# Patient Record
Sex: Female | Born: 1965 | Race: White | Hispanic: No | Marital: Married | Smoking: Former smoker
Health system: Southern US, Community
[De-identification: ages and names within clinical notes are randomized; demographics above are authoritative.]

## PROBLEM LIST (undated history)

## (undated) DIAGNOSIS — F32A Depression, unspecified: Secondary | ICD-10-CM

## (undated) DIAGNOSIS — E785 Hyperlipidemia, unspecified: Secondary | ICD-10-CM

## (undated) DIAGNOSIS — N63 Unspecified lump in unspecified breast: Principal | ICD-10-CM

## (undated) DIAGNOSIS — K5909 Other constipation: Secondary | ICD-10-CM

## (undated) DIAGNOSIS — D649 Anemia, unspecified: Secondary | ICD-10-CM

## (undated) DIAGNOSIS — K589 Irritable bowel syndrome without diarrhea: Secondary | ICD-10-CM

## (undated) DIAGNOSIS — N390 Urinary tract infection, site not specified: Secondary | ICD-10-CM

## (undated) DIAGNOSIS — B379 Candidiasis, unspecified: Secondary | ICD-10-CM

## (undated) DIAGNOSIS — G43909 Migraine, unspecified, not intractable, without status migrainosus: Secondary | ICD-10-CM

## (undated) DIAGNOSIS — Z87442 Personal history of urinary calculi: Secondary | ICD-10-CM

## (undated) DIAGNOSIS — F1010 Alcohol abuse, uncomplicated: Secondary | ICD-10-CM

## (undated) DIAGNOSIS — E039 Hypothyroidism, unspecified: Secondary | ICD-10-CM

## (undated) DIAGNOSIS — T4145XA Adverse effect of unspecified anesthetic, initial encounter: Secondary | ICD-10-CM

## (undated) DIAGNOSIS — R55 Syncope and collapse: Secondary | ICD-10-CM

## (undated) DIAGNOSIS — E079 Disorder of thyroid, unspecified: Secondary | ICD-10-CM

## (undated) DIAGNOSIS — I959 Hypotension, unspecified: Secondary | ICD-10-CM

## (undated) DIAGNOSIS — M1990 Unspecified osteoarthritis, unspecified site: Secondary | ICD-10-CM

## (undated) DIAGNOSIS — F419 Anxiety disorder, unspecified: Secondary | ICD-10-CM

## (undated) DIAGNOSIS — K635 Polyp of colon: Secondary | ICD-10-CM

## (undated) DIAGNOSIS — F329 Major depressive disorder, single episode, unspecified: Secondary | ICD-10-CM

## (undated) DIAGNOSIS — R42 Dizziness and giddiness: Secondary | ICD-10-CM

## (undated) HISTORY — PX: INCISIONAL BREAST BIOPSY: SHX1812

## (undated) HISTORY — DX: Irritable bowel syndrome, unspecified: K58.9

## (undated) HISTORY — DX: Polyp of colon: K63.5

## (undated) HISTORY — PX: WISDOM TOOTH EXTRACTION: SHX21

## (undated) HISTORY — PX: BREAST ENHANCEMENT SURGERY: SHX7

## (undated) HISTORY — PX: UPPER GI ENDOSCOPY: SHX6162

## (undated) HISTORY — DX: Migraine, unspecified, not intractable, without status migrainosus: G43.909

## (undated) HISTORY — PX: COLONOSCOPY: SHX174

## (undated) HISTORY — DX: Urinary tract infection, site not specified: N39.0

## (undated) HISTORY — DX: Hyperlipidemia, unspecified: E78.5

## (undated) HISTORY — PX: ABDOMINAL HYSTERECTOMY: SHX81

## (undated) HISTORY — PX: CYSTECTOMY: SUR359

## (undated) HISTORY — PX: APPENDECTOMY: SHX54

## (undated) HISTORY — DX: Other constipation: K59.09

---

## 2007-04-29 ENCOUNTER — Encounter: Admission: RE | Admit: 2007-04-29 | Discharge: 2007-04-29 | Payer: Self-pay | Admitting: Obstetrics and Gynecology

## 2008-04-25 ENCOUNTER — Emergency Department (HOSPITAL_COMMUNITY): Admission: EM | Admit: 2008-04-25 | Discharge: 2008-04-25 | Payer: Self-pay | Admitting: Family Medicine

## 2008-05-08 ENCOUNTER — Encounter: Admission: RE | Admit: 2008-05-08 | Discharge: 2008-05-08 | Payer: Self-pay | Admitting: Obstetrics and Gynecology

## 2008-05-15 ENCOUNTER — Encounter: Admission: RE | Admit: 2008-05-15 | Discharge: 2008-05-15 | Payer: Self-pay | Admitting: Obstetrics and Gynecology

## 2009-03-01 ENCOUNTER — Encounter: Admission: RE | Admit: 2009-03-01 | Discharge: 2009-03-01 | Payer: Self-pay | Admitting: *Deleted

## 2009-05-16 ENCOUNTER — Encounter: Admission: RE | Admit: 2009-05-16 | Discharge: 2009-05-16 | Payer: Self-pay | Admitting: Obstetrics and Gynecology

## 2009-07-20 ENCOUNTER — Emergency Department (HOSPITAL_COMMUNITY): Admission: EM | Admit: 2009-07-20 | Discharge: 2009-07-20 | Payer: Self-pay | Admitting: Emergency Medicine

## 2009-08-23 ENCOUNTER — Encounter: Admission: RE | Admit: 2009-08-23 | Discharge: 2009-08-23 | Payer: Self-pay | Admitting: Sports Medicine

## 2010-02-09 ENCOUNTER — Encounter: Payer: Self-pay | Admitting: Obstetrics and Gynecology

## 2010-04-21 ENCOUNTER — Other Ambulatory Visit: Payer: Self-pay | Admitting: Obstetrics and Gynecology

## 2010-04-21 DIAGNOSIS — N6459 Other signs and symptoms in breast: Secondary | ICD-10-CM

## 2010-04-21 DIAGNOSIS — Z1231 Encounter for screening mammogram for malignant neoplasm of breast: Secondary | ICD-10-CM

## 2010-04-30 LAB — URINALYSIS, ROUTINE W REFLEX MICROSCOPIC
Hgb urine dipstick: NEGATIVE
Ketones, ur: 15 mg/dL — AB
Nitrite: NEGATIVE
Protein, ur: NEGATIVE mg/dL
Specific Gravity, Urine: 1.014 (ref 1.005–1.030)
pH: 6 (ref 5.0–8.0)

## 2010-04-30 LAB — TSH: TSH: 2.582 u[IU]/mL (ref 0.350–4.500)

## 2010-04-30 LAB — RAPID URINE DRUG SCREEN, HOSP PERFORMED
Amphetamines: NOT DETECTED
Barbiturates: NOT DETECTED
Benzodiazepines: POSITIVE — AB
Cocaine: NOT DETECTED

## 2010-04-30 LAB — COMPREHENSIVE METABOLIC PANEL
ALT: 32 U/L (ref 0–35)
AST: 31 U/L (ref 0–37)
Alkaline Phosphatase: 69 U/L (ref 39–117)
BUN: 10 mg/dL (ref 6–23)
Calcium: 9.3 mg/dL (ref 8.4–10.5)
Creatinine, Ser: 0.45 mg/dL (ref 0.4–1.2)
GFR calc non Af Amer: >60 mL/min (ref >60)
Sodium: 138 mEq/L (ref 135–145)

## 2010-04-30 LAB — CBC
MCHC: 34.2 g/dL (ref 30.0–36.0)
MCV: 104.3 fL — ABNORMAL HIGH (ref 78.0–100.0)
RBC: 4.06 MIL/uL (ref 3.87–5.11)
RDW: 12.8 % (ref 11.5–15.5)
WBC: 7.6 10*3/uL (ref 4.0–10.5)

## 2010-04-30 LAB — DIFFERENTIAL
Basophils Absolute: 0.1 10*3/uL (ref 0.0–0.1)
Basophils Relative: 1 % (ref 0–1)
Eosinophils Absolute: 0 10*3/uL (ref 0.0–0.7)
Monocytes Absolute: 0.7 10*3/uL (ref 0.1–1.0)
Monocytes Relative: 10 % (ref 3–12)
Neutrophils Relative %: 66 % (ref 43–77)

## 2010-04-30 LAB — FOLATE: Folate: >20 ng/mL

## 2010-05-19 ENCOUNTER — Ambulatory Visit
Admission: RE | Admit: 2010-05-19 | Discharge: 2010-05-19 | Disposition: A | Discharge status: Home / Self Care | Payer: BC Managed Care – PPO | Source: Ambulatory Visit | Attending: Obstetrics and Gynecology | Admitting: Obstetrics and Gynecology

## 2010-05-19 ENCOUNTER — Ambulatory Visit: Payer: Self-pay

## 2010-05-19 ENCOUNTER — Other Ambulatory Visit: Payer: Self-pay | Admitting: Obstetrics and Gynecology

## 2010-05-19 DIAGNOSIS — N6459 Other signs and symptoms in breast: Secondary | ICD-10-CM

## 2010-05-31 ENCOUNTER — Emergency Department (HOSPITAL_COMMUNITY): Payer: BC Managed Care – PPO

## 2010-05-31 ENCOUNTER — Emergency Department (HOSPITAL_COMMUNITY)
Admission: EM | Admit: 2010-05-31 | Discharge: 2010-05-31 | Disposition: A | Discharge status: Home / Self Care | Payer: BC Managed Care – PPO | Attending: Emergency Medicine | Admitting: Emergency Medicine

## 2010-05-31 DIAGNOSIS — K589 Irritable bowel syndrome without diarrhea: Secondary | ICD-10-CM | POA: Insufficient documentation

## 2010-05-31 DIAGNOSIS — R109 Unspecified abdominal pain: Secondary | ICD-10-CM | POA: Insufficient documentation

## 2010-05-31 DIAGNOSIS — R112 Nausea with vomiting, unspecified: Secondary | ICD-10-CM | POA: Insufficient documentation

## 2010-05-31 LAB — URINALYSIS, ROUTINE W REFLEX MICROSCOPIC
Glucose, UA: NEGATIVE mg/dL
Leukocytes, UA: NEGATIVE
Nitrite: NEGATIVE
Urobilinogen, UA: 0.2 mg/dL (ref 0.0–1.0)

## 2010-05-31 LAB — DIFFERENTIAL
Basophils Absolute: 0 10*3/uL (ref 0.0–0.1)
Basophils Relative: 0 % (ref 0–1)
Eosinophils Relative: 0 % (ref 0–5)
Lymphs Abs: 1.4 10*3/uL (ref 0.7–4.0)
Monocytes Relative: 9 % (ref 3–12)

## 2010-05-31 LAB — POCT PREGNANCY, URINE: Preg Test, Ur: NEGATIVE

## 2010-05-31 LAB — COMPREHENSIVE METABOLIC PANEL
ALT: 15 U/L (ref 0–35)
AST: 24 U/L (ref 0–37)
Calcium: 8.7 mg/dL (ref 8.4–10.5)
GFR calc Af Amer: >60 mL/min (ref >60)
Sodium: 138 mEq/L (ref 135–145)
Total Protein: 6.9 g/dL (ref 6.0–8.3)

## 2010-05-31 LAB — URINE MICROSCOPIC-ADD ON

## 2010-05-31 LAB — CBC
MCHC: 35.5 g/dL (ref 30.0–36.0)
RDW: 12.1 % (ref 11.5–15.5)

## 2010-05-31 MED ORDER — IOHEXOL 300 MG/ML  SOLN: 75.0000 mL | Freq: Once | INTRAMUSCULAR | Status: DC | PRN

## 2011-04-28 ENCOUNTER — Other Ambulatory Visit: Payer: Self-pay | Admitting: Obstetrics and Gynecology

## 2011-04-28 DIAGNOSIS — Z1231 Encounter for screening mammogram for malignant neoplasm of breast: Secondary | ICD-10-CM

## 2011-05-20 ENCOUNTER — Ambulatory Visit
Admission: RE | Admit: 2011-05-20 | Discharge: 2011-05-20 | Disposition: A | Discharge status: Home / Self Care | Payer: BC Managed Care – PPO | Source: Ambulatory Visit | Attending: Obstetrics and Gynecology | Admitting: Obstetrics and Gynecology

## 2011-05-20 ENCOUNTER — Other Ambulatory Visit: Payer: Self-pay | Admitting: Obstetrics and Gynecology

## 2011-05-20 DIAGNOSIS — Z1231 Encounter for screening mammogram for malignant neoplasm of breast: Secondary | ICD-10-CM

## 2011-09-03 ENCOUNTER — Other Ambulatory Visit: Payer: Self-pay | Admitting: Gastroenterology

## 2011-09-03 DIAGNOSIS — R109 Unspecified abdominal pain: Secondary | ICD-10-CM

## 2011-10-09 ENCOUNTER — Encounter (HOSPITAL_COMMUNITY)
Admission: RE | Admit: 2011-10-09 | Discharge: 2011-10-09 | Disposition: A | Discharge status: Home / Self Care | Payer: BC Managed Care – PPO | Source: Ambulatory Visit | Attending: Gastroenterology | Admitting: Gastroenterology

## 2011-10-09 DIAGNOSIS — R109 Unspecified abdominal pain: Secondary | ICD-10-CM | POA: Insufficient documentation

## 2011-10-09 MED ORDER — TECHNETIUM TC 99M SULFUR COLLOID
2.0000 | Freq: Once | INTRAVENOUS | Status: AC | PRN
  Administered 2011-10-09: 2 via ORAL

## 2012-01-24 ENCOUNTER — Encounter (HOSPITAL_COMMUNITY): Payer: Self-pay | Admitting: Family Medicine

## 2012-01-24 ENCOUNTER — Emergency Department (HOSPITAL_COMMUNITY)
Admission: EM | Admit: 2012-01-24 | Discharge: 2012-01-24 | Disposition: A | Discharge status: Home / Self Care | Payer: BC Managed Care – PPO | Source: Home / Self Care

## 2012-01-24 DIAGNOSIS — J208 Acute bronchitis due to other specified organisms: Secondary | ICD-10-CM

## 2012-01-24 DIAGNOSIS — J209 Acute bronchitis, unspecified: Secondary | ICD-10-CM

## 2012-01-24 HISTORY — DX: Depression, unspecified: F32.A

## 2012-01-24 HISTORY — DX: Disorder of thyroid, unspecified: E07.9

## 2012-01-24 HISTORY — DX: Major depressive disorder, single episode, unspecified: F32.9

## 2012-01-24 MED ORDER — ALBUTEROL SULFATE HFA 108 (90 BASE) MCG/ACT IN AERS: 2.0000 | INHALATION_SPRAY | Freq: Four times a day (QID) | RESPIRATORY_TRACT | Status: DC | PRN

## 2012-01-24 MED ORDER — ALBUTEROL SULFATE (5 MG/ML) 0.5% IN NEBU
INHALATION_SOLUTION | RESPIRATORY_TRACT | Status: AC
  Filled 2012-01-24: qty 1

## 2012-01-24 MED ORDER — ALBUTEROL SULFATE (5 MG/ML) 0.5% IN NEBU
5.0000 mg | INHALATION_SOLUTION | Freq: Once | RESPIRATORY_TRACT | Status: AC
  Administered 2012-01-24: 5 mg via RESPIRATORY_TRACT

## 2012-01-24 MED ORDER — HYDROCODONE-HOMATROPINE 5-1.5 MG/5ML PO SYRP: 5.0000 mL | ORAL_SOLUTION | Freq: Four times a day (QID) | ORAL | Status: DC | PRN

## 2012-01-24 NOTE — ED Notes (Signed)
Patient complains of head and chest congestion, with fever/chills x 4 days. Denies nausea, vomiting, diarrhea.

## 2012-01-24 NOTE — ED Provider Notes (Signed)
Medical screening examination/treatment/procedure(s) were performed by non-physician practitioner and as supervising physician I was immediately available for consultation/collaboration.  Raynald Blend, MD 01/24/12 [phone]

## 2012-01-24 NOTE — ED Provider Notes (Signed)
Loretta Dawson is a 47 y.o. female who presents to Urgent Care today for body aches, sneezing, productive cough, wheezing, and chills since Thursday.  Her symptoms have worsened yesterday and today,  she denies any significant shortness of breath or fevers.  She has been taking Tylenol ibuprofen, and Mucinex which have helped a bit.  She feels as though she may have the flu, however she did receive a flu vaccination this year.  She notes her cough is productive to green sputum with slight flecks of blood.  She notes the cough is quite bothersome.     PMH reviewed. Depression, and hypothyroidism History  Substance Use Topics  . Smoking status: Not on file  . Smokeless tobacco: Not on file  . Alcohol Use: Not on file   ROS as above Medications reviewed. Home medications include Synthroid, and Cymbalta.  Allergies to Cipro, sulfa, and codeine. Patient can take hydrocodone.   Exam:  BP 116/84  Pulse 89  Temp 98.5 F (36.9 C) (Oral)  Resp 19  SpO2 98% Gen: Well NAD, nontoxic appearing HEENT: EOMI,  MMM Lungs: Normal work of breathing. Lungs have slight rhonchorous sounds bilaterally with slight expiratory wheezing diffusely.  No focal rails. Heart: RRR no MRG Abd: NABS, NT, ND Exts: Non edematous BL  LE, warm and well perfused.   Albuterol 5 mg nebulized was provided. Following albuterol treatment patient's had subjective improvement in her lung sounds cleared.  No results found for this or any previous visit (from the past 24 hour(s)). No results found.  Assessment and Plan: 47 y.o. female with viral bronchitis with URI.  No signs or symptoms of pneumonia or other bacterial process. A she improved with albuterol feel that symptomatic treatment with home albuterol HFA, and prescription cough medicine is reasonable. Will use hydrocodone as she has taken it in the past successfully, and she is allergic to codeine.  Discussed warning signs or symptoms. Please see discharge instructions.  Patient expresses understanding. Followup as needed.      Rodolph Bong, MD 01/24/12 1356

## 2012-05-20 ENCOUNTER — Other Ambulatory Visit: Payer: Self-pay

## 2012-05-20 DIAGNOSIS — Z1231 Encounter for screening mammogram for malignant neoplasm of breast: Secondary | ICD-10-CM

## 2012-06-29 ENCOUNTER — Ambulatory Visit
Admission: RE | Admit: 2012-06-29 | Discharge: 2012-06-29 | Disposition: A | Discharge status: Home / Self Care | Payer: BC Managed Care – PPO | Source: Ambulatory Visit

## 2012-06-29 DIAGNOSIS — Z1231 Encounter for screening mammogram for malignant neoplasm of breast: Secondary | ICD-10-CM

## 2013-05-30 ENCOUNTER — Encounter: Payer: Self-pay | Admitting: *Deleted

## 2013-05-31 ENCOUNTER — Encounter (INDEPENDENT_AMBULATORY_CARE_PROVIDER_SITE_OTHER): Payer: Self-pay

## 2013-05-31 ENCOUNTER — Ambulatory Visit (INDEPENDENT_AMBULATORY_CARE_PROVIDER_SITE_OTHER): Payer: BC Managed Care – PPO | Admitting: Neurology

## 2013-05-31 ENCOUNTER — Encounter: Payer: Self-pay | Admitting: Neurology

## 2013-05-31 VITALS — BP 107/73 | HR 80 | Ht 64.0 in | Wt 140.0 lb

## 2013-05-31 DIAGNOSIS — R519 Headache, unspecified: Secondary | ICD-10-CM | POA: Insufficient documentation

## 2013-05-31 DIAGNOSIS — R51 Headache: Secondary | ICD-10-CM

## 2013-05-31 MED ORDER — DICLOFENAC POTASSIUM(MIGRAINE) 50 MG PO PACK: 50.0000 mg | PACK | Freq: Once | ORAL | Status: DC | PRN

## 2013-05-31 MED ORDER — METOCLOPRAMIDE HCL 10 MG PO TABS: 10.0000 mg | ORAL_TABLET | Freq: Three times a day (TID) | ORAL | Status: DC | PRN

## 2013-05-31 NOTE — Patient Instructions (Signed)
Overall you are doing fairly well but I do want to suggest a few things today:   Remember to drink plenty of fluid, eat healthy meals and do not skip any meals. Try to eat protein with a every meal and eat a healthy snack such as fruit or nuts in between meals. Try to keep a regular sleep-wake schedule and try to exercise daily, particularly in the form of walking, 20-30 minutes a day, if you can.   As far as your medications are concerned, I would like to suggest the following: 1)Continue to use the ibuprofen as needed for your headaches 2)If you don't get relief with this then please try taking a cambia packet and/or reglan as instructed on the prescription. If you headache persists after 2-3 days then please call our office  I can see you back as needed. Please call us with any interim questions, concerns, problems, updates or refill requests.   My clinical assistant and will answer any of your questions and relay your messages to me and also relay most of my messages to you.   Our phone number is [phone]. We also have an after hours call service for urgent matters and there is a physician on-call for urgent questions. For any emergencies you know to call 911 or go to the nearest emergency room

## 2013-05-31 NOTE — Progress Notes (Signed)
[facility]    Provider:  Dr Janann Colonel Referring Provider: Precious Reel, MD Primary Care Physician:  Precious Reel, MD  CC:  Migraine headache  HPI:  Loretta Dawson is a 48 y.o. female here as a referral from Dr. Virgina Jock for migraine headaches  Started in the early 2000s. Last headache was around 6 weeks ago. Headache resolved with prednisone. Headache was bitemporal, described as a squeezing pounding type pain, got to a 9/10 at its worst. Had mild nausea, no emesis, + photophobia. No dizziness or light headed sensation. No vertigo with this headache, has had vertigo with headaches in the past. No focal weakness or sensory changes. Some blurry vision with the headache. For recent headache tried hydrocodone with no benefit, then got good relief with prednisone taper. Prior to this headache it had been months since she had a severe headache. She is averaging around 1 headache a month, typically lasting 1-2 days, takes advil and it often resolves. No known triggers. Reports sleeping well overall. Recent headaches have been occuring after cardiovascular workouts.   Has history of degenerative disc at C5-6. Notes some pain radiating down bilateral arms L>R, radiates down into the first 3 fingers.   Has tried Topamax in the past for headaches which did not. Currently on lamictal (started for mood stabilization) but currently working well for headache.   Review of Systems: Out of a complete 14 system review, the patient complains of only the following symptoms, and all other reviewed systems are negative. + blurred vision, eye pain, weight gain, headache  History   Social History  . Marital Status: Single    Spouse Name: N/A    Number of Children: N/A  . Years of Education: N/A   Occupational History  . Not on file.   Social History Main Topics  . Smoking status: Former Research scientist (life sciences)  . Smokeless tobacco: Not on file  . Alcohol Use: No  . Drug Use: No  . Sexual Activity: Not  on file   Other Topics Concern  . Not on file   Social History Narrative   Patient is married to Randall Hiss).   Patient does not have any biological children, 1 step daughter.          Family History  Problem Relation Age of Onset  . Breast cancer Mother   . Hypertension Mother   . Thyroid disease Mother   . Cancer Father   . Diabetes Father   . Hyperlipidemia Father   . Thyroid disease Father   . Asthma    . COPD    . Alcoholism    . Heart disease    . Other      Lipid disorder    Past Medical History  Diagnosis Date  . Thyroid disease   . Depression   . Hyperlipidemia   . Migraine   . Chronic constipation   . UTI (lower urinary tract infection)   . Colon polyp   . Irritable bowel syndrome     extreme    Past Surgical History  Procedure Laterality Date  . Cystectomy    . Breast enhancement surgery    . Appendectomy    . Incisional breast biopsy      Current Outpatient Prescriptions  Medication Sig Dispense Refill  . albuterol (PROVENTIL HFA;VENTOLIN HFA) 108 (90 BASE) MCG/ACT inhaler Inhale 2 puffs into the lungs every 6 (six) hours as needed for wheezing.  1 Inhaler  2  . busPIRone (BUSPAR) 15 MG tablet  Take 15 mg by mouth 3 (three) times daily.      . Calcium-Vitamin D-Vitamin K (CALCIUM + D + K PO) Take by mouth.      . Chromium 200 MCG TABS Take 200 mcg by mouth.      Marland Kitchen CINNAMON PO Take by mouth.      . estazolam (PROSOM) 1 MG tablet Take 1 mg by mouth at bedtime.      . fexofenadine (ALLEGRA) 180 MG tablet Take 180 mg by mouth daily.      Marland Kitchen HYDROcodone-homatropine (HYCODAN) 5-1.5 MG/5ML syrup Take 5 mLs by mouth every 6 (six) hours as needed for cough.  120 mL  0  . lamoTRIgine (LAMICTAL) 200 MG tablet Take 200 mg by mouth daily.      . Levomilnacipran HCl ER (FETZIMA) 80 MG CP24 Take 80 mg by mouth.      . levothyroxine (SYNTHROID, LEVOTHROID) 75 MCG tablet Take 75 mcg by mouth daily before breakfast.      . MAGNESIUM PO Take by mouth.      . Multiple  Vitamins-Minerals (MULTIVITAMIN WITH MINERALS) tablet Take 1 tablet by mouth daily.      . Omega-3 Fatty Acids (FISH OIL) 1000 MG CAPS Take 1,000 mg by mouth.      . predniSONE (DELTASONE) 20 MG tablet Take 20 mg by mouth daily with breakfast.      . simethicone (MYLICON) 035 MG chewable tablet Chew 125 mg by mouth every 6 (six) hours as needed for flatulence.      Marland Kitchen ZINC GLUCONATE ER PO Take by mouth.       No current facility-administered medications for this visit.    Allergies as of 05/31/2013 - Review Complete 05/31/2013  Allergen Reaction Noted  . Ciprocinonide [fluocinolone] Rash 01/24/2012  . Codeine Rash 01/24/2012  . Sulfa antibiotics Rash 01/24/2012    Vitals: BP 107/73  Pulse 80  Ht 5\' 4"  (1.626 m)  Wt 140 lb (63.504 kg)  BMI 24.02 kg/m2 Last Weight:  Wt Readings from Last 1 Encounters:  05/31/13 140 lb (63.504 kg)   Last Height:   Ht Readings from Last 1 Encounters:  05/31/13 5\' 4"  (1.626 m)     Physical exam: Exam: Gen: NAD, conversant Eyes: anicteric sclerae, moist conjunctivae HENT: Atraumatic, oropharynx clear Neck: Trachea midline; supple,  Lungs: CTA, no wheezing, rales, rhonic                          CV: RRR, no MRG Abdomen: Soft, non-tender;  Extremities: No peripheral edema  Skin: Normal temperature, no rash,  Psych: Appropriate affect, pleasant  Neuro: MS: AA&Ox3, appropriately interactive, normal affect   Attention: WORLD backwards  Speech: fluent w/o paraphasic error  Memory: good recent and remote recall  CN: PERRL, VFF to FC bilat, fundoscopic exam wnl bilat, EOMI no nystagmus, no ptosis, sensation intact to LT V1-V3 bilat, face symmetric, no weakness, hearing grossly intact, palate elevates symmetrically, shoulder shrug 5/5 bilat,  tongue protrudes midline, no fasiculations noted.  Motor: normal bulk and tone Strength: 5/5  In all extremities  Coord: rapid alternating and point-to-point (FNF, HTS) movements  intact.  Reflexes: symmetrical, bilat downgoing toes  Sens: LT intact in all extremities  Gait: posture, stance, stride and arm-swing normal. Tandem gait intact. Able to walk on heels and toes. Romberg absent.   Assessment:  After physical and neurologic examination, review of laboratory studies, imaging, neurophysiology testing and pre-existing records, assessment  will be reviewed on the problem list.  Plan:  Treatment plan and additional workup will be reviewed under Problem List.  1)Headache 2)Cervical degenerative disc disease  47y/o woman presenting for initial evaluation of headache. Based on description these headaches are most consistent with a diagnosis of migraine without aura. Based on the history of cervical degenerative disc disease there may also be a component of cervicogenic headache. With history of vertiginous migraines in the past would avoid use of triptans. Due to infrequent nature will hold off on prophylactic agent. Will start Cambia and Reglan as needed. Counseled patient on proper use and potential side effects. Patient will call office as needed for breakthrough headaches. Follow up as needed.    Jim Like, DO  [facility] [address] [place], [place] [address]  Phone [phone] Fax [phone]

## 2013-06-19 ENCOUNTER — Other Ambulatory Visit: Payer: Self-pay

## 2013-06-19 DIAGNOSIS — Z1231 Encounter for screening mammogram for malignant neoplasm of breast: Secondary | ICD-10-CM

## 2013-06-30 ENCOUNTER — Ambulatory Visit
Admission: RE | Admit: 2013-06-30 | Discharge: 2013-06-30 | Disposition: A | Discharge status: Home / Self Care | Payer: BC Managed Care – PPO | Source: Ambulatory Visit

## 2013-06-30 ENCOUNTER — Other Ambulatory Visit: Payer: Self-pay | Admitting: Obstetrics and Gynecology

## 2013-06-30 DIAGNOSIS — Z1231 Encounter for screening mammogram for malignant neoplasm of breast: Secondary | ICD-10-CM

## 2013-06-30 DIAGNOSIS — N6320 Unspecified lump in the left breast, unspecified quadrant: Secondary | ICD-10-CM

## 2013-07-13 ENCOUNTER — Ambulatory Visit
Admission: RE | Admit: 2013-07-13 | Discharge: 2013-07-13 | Disposition: A | Discharge status: Home / Self Care | Payer: BC Managed Care – PPO | Source: Ambulatory Visit | Attending: Obstetrics and Gynecology | Admitting: Obstetrics and Gynecology

## 2013-07-13 DIAGNOSIS — N6320 Unspecified lump in the left breast, unspecified quadrant: Secondary | ICD-10-CM

## 2015-04-16 ENCOUNTER — Other Ambulatory Visit: Payer: Self-pay | Admitting: Obstetrics and Gynecology

## 2015-04-22 NOTE — Patient Instructions (Addendum)
Your procedure is scheduled on:  Thursday, May 02, 2015  Enter through the Main Entrance of [facility] at: 6:30 AM  Pick up the phone at the desk and dial [phone].  Call this number if you have problems the morning of surgery: [phone].  Remember: Do NOT eat food or drink after:  Midnight Wednesday  Take these medicines the morning of surgery with a SIP OF WATER: Cymbalta, Petra Kuba Throid  Stop taking fish oil at this time  Do NOT wear jewelry (body piercing), metal hair clips/bobby pins, make-up, or nail polish. Do NOT wear lotions, powders, or perfumes.  You may wear deodorant. Do NOT shave for 48 hours prior to surgery. Do NOT bring valuables to the hospital. Contacts, dentures, or bridgework may not be worn into surgery.  Leave suitcase in car.  After surgery it may be brought to your room.  For patients admitted to the hospital, checkout time is 11:00 AM the day of discharge.  Bring a copy of [place] to be scanned into your chart.

## 2015-04-23 ENCOUNTER — Encounter (HOSPITAL_COMMUNITY): Payer: Self-pay

## 2015-04-23 ENCOUNTER — Encounter (HOSPITAL_COMMUNITY)
Admission: RE | Admit: 2015-04-23 | Discharge: 2015-04-23 | Disposition: A | Discharge status: Home / Self Care | Payer: BLUE CROSS/BLUE SHIELD | Source: Ambulatory Visit | Attending: Obstetrics and Gynecology | Admitting: Obstetrics and Gynecology

## 2015-04-23 DIAGNOSIS — Z01812 Encounter for preprocedural laboratory examination: Secondary | ICD-10-CM | POA: Insufficient documentation

## 2015-04-23 HISTORY — DX: Candidiasis, unspecified: B37.9

## 2015-04-23 HISTORY — DX: Adverse effect of unspecified anesthetic, initial encounter: T41.45XA

## 2015-04-23 HISTORY — DX: Hypothyroidism, unspecified: E03.9

## 2015-04-23 HISTORY — DX: Hypotension, unspecified: I95.9

## 2015-04-23 HISTORY — DX: Syncope and collapse: R55

## 2015-04-23 HISTORY — DX: Anemia, unspecified: D64.9

## 2015-04-23 HISTORY — DX: Anxiety disorder, unspecified: F41.9

## 2015-04-23 HISTORY — DX: Unspecified osteoarthritis, unspecified site: M19.90

## 2015-04-23 HISTORY — DX: Dizziness and giddiness: R42

## 2015-04-23 LAB — TYPE AND SCREEN
ABO/RH(D): A POS
Antibody Screen: NEGATIVE

## 2015-04-23 LAB — CBC
HCT: 36.9 % (ref 36.0–46.0)
Hemoglobin: 12.5 g/dL (ref 12.0–15.0)
MCH: 29 pg (ref 26.0–34.0)
MCHC: 33.9 g/dL (ref 30.0–36.0)
MCV: 85.6 fL (ref 78.0–100.0)
PLATELETS: 390 10*3/uL (ref 150–400)
RBC: 4.31 MIL/uL (ref 3.87–5.11)
RDW: 14.9 % (ref 11.5–15.5)
WBC: 6.9 10*3/uL (ref 4.0–10.5)

## 2015-04-23 LAB — ABO/RH: ABO/RH(D): A POS

## 2015-04-23 LAB — BASIC METABOLIC PANEL
Anion gap: 8 (ref 5–15)
BUN: 12 mg/dL (ref 6–20)
CALCIUM: 9.7 mg/dL (ref 8.9–10.3)
CO2: 26 mmol/L (ref 22–32)
CREATININE: 0.58 mg/dL (ref 0.44–1.00)
Chloride: 106 mmol/L (ref 101–111)
GFR calc non Af Amer: >60 mL/min (ref >60)
Glucose, Bld: 95 mg/dL (ref 65–99)
Potassium: 4.2 mmol/L (ref 3.5–5.1)
SODIUM: 140 mmol/L (ref 135–145)

## 2015-05-01 NOTE — H&P (Signed)
NAMEAMIYAH, Loretta Dawson               ACCOUNT NO.:  [id number]  MEDICAL RECORD NO.:  [id number]  LOCATION:  PERIO                         FACILITY:  [facility]  PHYSICIAN:  Lovenia Kim, M.D.DATE OF BIRTH:  August 16, 1965  DATE OF ADMISSION:  03/28/2015 DATE OF DISCHARGE:                             HISTORY & PHYSICAL   CHIEF COMPLAINT:  Dysmenorrhea and abnormal uterine bleeding.  HISTORY OF PRESENT ILLNESS:  A 50 year old white female, G1, P0, with abnormal uterine bleeding, normal endometrial biopsy, dysmenorrhea for definitive therapy.  MEDICATIONS:  Include progesterone, Lamictal, thyroid supplementation, Cymbalta.  SOCIAL HISTORY:  She is a nonsmoker, nondrinker.  She denies domestic physical violence.  PAST MEDICAL HISTORY:  She has a history of SAB x1 and a history of a LEEP, history of appendectomy.  FAMILY HISTORY:  Family history of breast cancer, uterine cancer, cholesterol issues, diabetes, chronic hypertension, and melanoma.  ALLERGIES:  To CODEINE, SULFA, CIPRO.  PHYSICAL EXAMINATION:  GENERAL:  She is a well-developed, well- nourished, white female, in no acute distress. HEENT:  Normal. NECK:  Supple.  Full range of motion. LUNGS:  Clear. HEART:  Regular rate and rhythm. ABDOMEN:  Soft, nontender. PELVIC:  Reveals an anteflexed uterus and no adnexal masses. EXTREMITIES:  There are no cords. NEUROLOGIC:  Nonfocal. SKIN:  Intact.  IMPRESSION: 1. Severe dysmenorrhea and abnormal uterine bleeding with normal     endometrial biopsy for definitive therapy. 2. Mother with history of breast cancer.  PLAN:  Plan is to proceed with da Vinci assisted total laparoscopic hysterectomy, bilateral salpingo-oophorectomy.  Risks of anesthesia, infection, bleeding, injury to surrounding organs with possible need for repair was discussed.  Delayed versus immediate complications to include bowel and bladder injury noted.  The patient acknowledges and wishes  to proceed.     Lovenia Kim, M.D.     RJT/MEDQ  D:  05/01/2015  T:  05/01/2015  Job:  [id number]

## 2015-05-02 ENCOUNTER — Ambulatory Visit (HOSPITAL_COMMUNITY): Payer: BLUE CROSS/BLUE SHIELD | Admitting: Anesthesiology

## 2015-05-02 ENCOUNTER — Encounter (HOSPITAL_COMMUNITY): Payer: Self-pay | Admitting: Registered Nurse

## 2015-05-02 ENCOUNTER — Encounter (HOSPITAL_COMMUNITY)
Admission: RE | Disposition: A | Discharge status: Home / Self Care | Payer: Self-pay | Source: Ambulatory Visit | Attending: Obstetrics and Gynecology

## 2015-05-02 ENCOUNTER — Ambulatory Visit (HOSPITAL_COMMUNITY)
Admission: RE | Admit: 2015-05-02 | Discharge: 2015-05-03 | Disposition: A | Discharge status: Home / Self Care | Payer: BLUE CROSS/BLUE SHIELD | Source: Ambulatory Visit | Attending: Obstetrics and Gynecology | Admitting: Obstetrics and Gynecology

## 2015-05-02 DIAGNOSIS — Z87891 Personal history of nicotine dependence: Secondary | ICD-10-CM | POA: Insufficient documentation

## 2015-05-02 DIAGNOSIS — M1990 Unspecified osteoarthritis, unspecified site: Secondary | ICD-10-CM | POA: Diagnosis not present

## 2015-05-02 DIAGNOSIS — N946 Dysmenorrhea, unspecified: Secondary | ICD-10-CM | POA: Diagnosis present

## 2015-05-02 DIAGNOSIS — Z885 Allergy status to narcotic agent status: Secondary | ICD-10-CM | POA: Insufficient documentation

## 2015-05-02 DIAGNOSIS — F329 Major depressive disorder, single episode, unspecified: Secondary | ICD-10-CM | POA: Insufficient documentation

## 2015-05-02 DIAGNOSIS — Z803 Family history of malignant neoplasm of breast: Secondary | ICD-10-CM | POA: Diagnosis not present

## 2015-05-02 DIAGNOSIS — F419 Anxiety disorder, unspecified: Secondary | ICD-10-CM | POA: Diagnosis not present

## 2015-05-02 DIAGNOSIS — D252 Subserosal leiomyoma of uterus: Secondary | ICD-10-CM | POA: Diagnosis not present

## 2015-05-02 DIAGNOSIS — N939 Abnormal uterine and vaginal bleeding, unspecified: Secondary | ICD-10-CM | POA: Diagnosis not present

## 2015-05-02 DIAGNOSIS — Z882 Allergy status to sulfonamides status: Secondary | ICD-10-CM | POA: Diagnosis not present

## 2015-05-02 DIAGNOSIS — Z881 Allergy status to other antibiotic agents status: Secondary | ICD-10-CM | POA: Insufficient documentation

## 2015-05-02 DIAGNOSIS — E039 Hypothyroidism, unspecified: Secondary | ICD-10-CM | POA: Diagnosis not present

## 2015-05-02 HISTORY — PX: ROBOTIC ASSISTED TOTAL HYSTERECTOMY WITH BILATERAL SALPINGO OOPHERECTOMY: SHX6086

## 2015-05-02 SURGERY — ROBOTIC ASSISTED TOTAL HYSTERECTOMY WITH BILATERAL SALPINGO OOPHORECTOMY
Anesthesia: General | Laterality: Bilateral

## 2015-05-02 MED ORDER — ACETAMINOPHEN 160 MG/5ML PO SOLN
ORAL | Status: AC
  Administered 2015-05-02: 650 mg via ORAL
  Filled 2015-05-02: qty 20.3

## 2015-05-02 MED ORDER — FENTANYL CITRATE (PF) 100 MCG/2ML IJ SOLN
INTRAMUSCULAR | Status: AC
  Administered 2015-05-02: 25 ug via INTRAVENOUS
  Filled 2015-05-02: qty 2

## 2015-05-02 MED ORDER — LACTATED RINGERS IR SOLN
Status: DC | PRN
  Administered 2015-05-02: 3000 mL

## 2015-05-02 MED ORDER — HYDROMORPHONE HCL 1 MG/ML IJ SOLN
INTRAMUSCULAR | Status: AC
  Filled 2015-05-02: qty 1

## 2015-05-02 MED ORDER — FENTANYL CITRATE (PF) 250 MCG/5ML IJ SOLN
INTRAMUSCULAR | Status: AC
  Filled 2015-05-02: qty 5

## 2015-05-02 MED ORDER — FENTANYL CITRATE (PF) 100 MCG/2ML IJ SOLN
INTRAMUSCULAR | Status: AC
  Filled 2015-05-02: qty 2

## 2015-05-02 MED ORDER — ONDANSETRON HCL 4 MG/2ML IJ SOLN
INTRAMUSCULAR | Status: DC | PRN
  Administered 2015-05-02: 4 mg via INTRAVENOUS

## 2015-05-02 MED ORDER — LIDOCAINE HCL (CARDIAC) 20 MG/ML IV SOLN
INTRAVENOUS | Status: DC | PRN
  Administered 2015-05-02: 60 mg via INTRAVENOUS

## 2015-05-02 MED ORDER — ACETAMINOPHEN 160 MG/5ML PO SOLN
650.0000 mg | Freq: Once | ORAL | Status: AC
  Administered 2015-05-02: 650 mg via ORAL

## 2015-05-02 MED ORDER — ARTIFICIAL TEARS OP OINT
TOPICAL_OINTMENT | OPHTHALMIC | Status: AC
  Filled 2015-05-02: qty 3.5

## 2015-05-02 MED ORDER — ONDANSETRON HCL 4 MG/2ML IJ SOLN
INTRAMUSCULAR | Status: AC
  Filled 2015-05-02: qty 2

## 2015-05-02 MED ORDER — SODIUM CHLORIDE 0.9% FLUSH: 9.0000 mL | INTRAVENOUS | Status: DC | PRN

## 2015-05-02 MED ORDER — ONDANSETRON HCL 4 MG/2ML IJ SOLN: 4.0000 mg | Freq: Four times a day (QID) | INTRAMUSCULAR | Status: DC | PRN

## 2015-05-02 MED ORDER — ROCURONIUM BROMIDE 100 MG/10ML IV SOLN
INTRAVENOUS | Status: DC | PRN
  Administered 2015-05-02: 20 mg via INTRAVENOUS
  Administered 2015-05-02: 60 mg via INTRAVENOUS

## 2015-05-02 MED ORDER — KETOROLAC TROMETHAMINE 30 MG/ML IJ SOLN
INTRAMUSCULAR | Status: AC
  Filled 2015-05-02: qty 1

## 2015-05-02 MED ORDER — SODIUM CHLORIDE 0.9 % IJ SOLN
INTRAMUSCULAR | Status: DC | PRN
  Administered 2015-05-02: 10 mL

## 2015-05-02 MED ORDER — ROPIVACAINE HCL 5 MG/ML IJ SOLN
INTRAMUSCULAR | Status: AC
  Filled 2015-05-02: qty 60

## 2015-05-02 MED ORDER — HYDROMORPHONE 1 MG/ML IV SOLN
INTRAVENOUS | Status: DC
  Administered 2015-05-02: 0.3 mg via INTRAVENOUS
  Administered 2015-05-02: 3 mg via INTRAVENOUS
  Administered 2015-05-02: 12:00:00 via INTRAVENOUS
  Administered 2015-05-02: 1.8 mg via INTRAVENOUS
  Administered 2015-05-03: 0.8 mg via INTRAVENOUS
  Administered 2015-05-03: 1.6 mg via INTRAVENOUS
  Filled 2015-05-02: qty 25

## 2015-05-02 MED ORDER — FENTANYL CITRATE (PF) 100 MCG/2ML IJ SOLN
INTRAMUSCULAR | Status: DC | PRN
  Administered 2015-05-02: 150 ug via INTRAVENOUS
  Administered 2015-05-02 (×2): 100 ug via INTRAVENOUS
  Administered 2015-05-02: 50 ug via INTRAVENOUS

## 2015-05-02 MED ORDER — VALACYCLOVIR HCL 500 MG PO TABS
500.0000 mg | ORAL_TABLET | Freq: Every day | ORAL | Status: DC
  Filled 2015-05-02: qty 1

## 2015-05-02 MED ORDER — ONDANSETRON HCL 4 MG/2ML IJ SOLN: 4.0000 mg | Freq: Once | INTRAMUSCULAR | Status: DC | PRN

## 2015-05-02 MED ORDER — BUPIVACAINE HCL (PF) 0.25 % IJ SOLN
INTRAMUSCULAR | Status: AC
  Filled 2015-05-02: qty 30

## 2015-05-02 MED ORDER — KETOROLAC TROMETHAMINE 30 MG/ML IJ SOLN
INTRAMUSCULAR | Status: DC | PRN
  Administered 2015-05-02: 30 mg via INTRAVENOUS

## 2015-05-02 MED ORDER — LACTATED RINGERS IV SOLN
INTRAVENOUS | Status: DC
  Administered 2015-05-02: 08:00:00 via INTRAVENOUS

## 2015-05-02 MED ORDER — LIDOCAINE HCL (CARDIAC) 20 MG/ML IV SOLN
INTRAVENOUS | Status: AC
  Filled 2015-05-02: qty 5

## 2015-05-02 MED ORDER — FENTANYL CITRATE (PF) 100 MCG/2ML IJ SOLN
25.0000 ug | INTRAMUSCULAR | Status: DC | PRN
  Administered 2015-05-02 (×2): 25 ug via INTRAVENOUS
  Administered 2015-05-02 (×2): 50 ug via INTRAVENOUS

## 2015-05-02 MED ORDER — DEXAMETHASONE SODIUM PHOSPHATE 4 MG/ML IJ SOLN
INTRAMUSCULAR | Status: DC | PRN
  Administered 2015-05-02: 8 mg via INTRAVENOUS

## 2015-05-02 MED ORDER — DEXTROSE IN LACTATED RINGERS 5 % IV SOLN
INTRAVENOUS | Status: DC
  Administered 2015-05-02: 22:00:00 via INTRAVENOUS

## 2015-05-02 MED ORDER — SUGAMMADEX SODIUM 200 MG/2ML IV SOLN
INTRAVENOUS | Status: AC
  Filled 2015-05-02: qty 2

## 2015-05-02 MED ORDER — DULOXETINE HCL 60 MG PO CPEP
90.0000 mg | ORAL_CAPSULE | Freq: Every day | ORAL | Status: DC
  Administered 2015-05-03: 90 mg via ORAL
  Filled 2015-05-02: qty 1

## 2015-05-02 MED ORDER — SODIUM CHLORIDE 0.9 % IJ SOLN
INTRAMUSCULAR | Status: AC
Start: 2015-05-02 — End: 2015-05-02
  Filled 2015-05-02: qty 10

## 2015-05-02 MED ORDER — DIPHENHYDRAMINE HCL 50 MG/ML IJ SOLN: 12.5000 mg | Freq: Four times a day (QID) | INTRAMUSCULAR | Status: DC | PRN

## 2015-05-02 MED ORDER — TRAMADOL HCL 50 MG PO TABS
50.0000 mg | ORAL_TABLET | Freq: Four times a day (QID) | ORAL | Status: DC | PRN
  Administered 2015-05-02 – 2015-05-03 (×2): 50 mg via ORAL
  Filled 2015-05-02 (×2): qty 1

## 2015-05-02 MED ORDER — ACETAMINOPHEN 325 MG PO TABS: 650.0000 mg | ORAL_TABLET | Freq: Once | ORAL | Status: DC

## 2015-05-02 MED ORDER — SCOPOLAMINE 1 MG/3DAYS TD PT72
MEDICATED_PATCH | TRANSDERMAL | Status: AC
  Administered 2015-05-02: 1.5 mg via TRANSDERMAL
  Filled 2015-05-02: qty 1

## 2015-05-02 MED ORDER — ROCURONIUM BROMIDE 100 MG/10ML IV SOLN
INTRAVENOUS | Status: AC
  Filled 2015-05-02: qty 1

## 2015-05-02 MED ORDER — SUGAMMADEX SODIUM 200 MG/2ML IV SOLN
INTRAVENOUS | Status: DC | PRN
  Administered 2015-05-02: 200 mg via INTRAVENOUS

## 2015-05-02 MED ORDER — PROPOFOL 10 MG/ML IV BOLUS
INTRAVENOUS | Status: DC | PRN
  Administered 2015-05-02: 170 mg via INTRAVENOUS

## 2015-05-02 MED ORDER — SODIUM CHLORIDE 0.9 % IJ SOLN
INTRAMUSCULAR | Status: AC
  Filled 2015-05-02: qty 50

## 2015-05-02 MED ORDER — SODIUM CHLORIDE 0.9 % IV SOLN
INTRAVENOUS | Status: DC | PRN
  Administered 2015-05-02: 120 mL

## 2015-05-02 MED ORDER — SCOPOLAMINE 1 MG/3DAYS TD PT72
1.0000 | MEDICATED_PATCH | Freq: Once | TRANSDERMAL | Status: DC
  Administered 2015-05-02: 1.5 mg via TRANSDERMAL

## 2015-05-02 MED ORDER — OXYCODONE-ACETAMINOPHEN 5-325 MG PO TABS: 1.0000 | ORAL_TABLET | ORAL | Status: DC | PRN

## 2015-05-02 MED ORDER — HYDROMORPHONE HCL 1 MG/ML IJ SOLN
INTRAMUSCULAR | Status: DC | PRN
  Administered 2015-05-02 (×2): 0.5 mg via INTRAVENOUS

## 2015-05-02 MED ORDER — MIDAZOLAM HCL 2 MG/2ML IJ SOLN
INTRAMUSCULAR | Status: AC
  Filled 2015-05-02: qty 2

## 2015-05-02 MED ORDER — PROPOFOL 10 MG/ML IV BOLUS
INTRAVENOUS | Status: AC
  Filled 2015-05-02: qty 20

## 2015-05-02 MED ORDER — MIDAZOLAM HCL 5 MG/5ML IJ SOLN
INTRAMUSCULAR | Status: DC | PRN
  Administered 2015-05-02: 2 mg via INTRAVENOUS

## 2015-05-02 MED ORDER — NALOXONE HCL 0.4 MG/ML IJ SOLN: 0.4000 mg | INTRAMUSCULAR | Status: DC | PRN

## 2015-05-02 MED ORDER — BUPIVACAINE HCL (PF) 0.25 % IJ SOLN
INTRAMUSCULAR | Status: DC | PRN
  Administered 2015-05-02: 20 mL

## 2015-05-02 MED ORDER — CEFAZOLIN SODIUM-DEXTROSE 2-3 GM-% IV SOLR
INTRAVENOUS | Status: AC
  Filled 2015-05-02: qty 50

## 2015-05-02 MED ORDER — LAMOTRIGINE 100 MG PO TABS
400.0000 mg | ORAL_TABLET | Freq: Every day | ORAL | Status: DC
  Administered 2015-05-02: 400 mg via ORAL
  Filled 2015-05-02: qty 4

## 2015-05-02 MED ORDER — DEXAMETHASONE SODIUM PHOSPHATE 10 MG/ML IJ SOLN
INTRAMUSCULAR | Status: AC
  Filled 2015-05-02: qty 1

## 2015-05-02 MED ORDER — DIPHENHYDRAMINE HCL 12.5 MG/5ML PO ELIX: 12.5000 mg | ORAL_SOLUTION | Freq: Four times a day (QID) | ORAL | Status: DC | PRN

## 2015-05-02 MED ORDER — CEFAZOLIN SODIUM-DEXTROSE 2-4 GM/100ML-% IV SOLN
2.0000 g | INTRAVENOUS | Status: AC
  Administered 2015-05-02: 2 g via INTRAVENOUS
  Filled 2015-05-02: qty 100

## 2015-05-02 SURGICAL SUPPLY — 59 items
BARRIER ADHS 3X4 INTERCEED (GAUZE/BANDAGES/DRESSINGS) ×3 IMPLANT
BRR ADH 4X3 ABS CNTRL BYND (GAUZE/BANDAGES/DRESSINGS) ×1
CATH FOLEY 3WAY  5CC 16FR (CATHETERS) ×2
CATH FOLEY 3WAY 5CC 16FR (CATHETERS) ×1 IMPLANT
CELL SAVER LIPIGURD (MISCELLANEOUS) IMPLANT
CLOTH BEACON ORANGE TIMEOUT ST (SAFETY) ×3 IMPLANT
CONT PATH 16OZ SNAP LID 3702 (MISCELLANEOUS) ×3 IMPLANT
COVER BACK TABLE 60X90IN (DRAPES) ×6 IMPLANT
COVER TIP SHEARS 8 DVNC (MISCELLANEOUS) ×1 IMPLANT
COVER TIP SHEARS 8MM DA VINCI (MISCELLANEOUS) ×2
DECANTER SPIKE VIAL GLASS SM (MISCELLANEOUS) ×12 IMPLANT
DEFOGGER SCOPE WARMER CLEARIFY (MISCELLANEOUS) ×3 IMPLANT
DEVICE RETRIEVAL ALEXIS 14 (MISCELLANEOUS) IMPLANT
DRSG COVADERM PLUS 2X2 (GAUZE/BANDAGES/DRESSINGS) ×12 IMPLANT
DRSG OPSITE POSTOP 3X4 (GAUZE/BANDAGES/DRESSINGS) ×3 IMPLANT
DURAPREP 26ML APPLICATOR (WOUND CARE) ×3 IMPLANT
ELECT REM PT RETURN 9FT ADLT (ELECTROSURGICAL) ×3
ELECTRODE REM PT RTRN 9FT ADLT (ELECTROSURGICAL) ×1 IMPLANT
EXTRT SYSTEM ALEXIS 14CM (MISCELLANEOUS)
GAUZE VASELINE 3X9 (GAUZE/BANDAGES/DRESSINGS) IMPLANT
GLOVE BIO SURGEON STRL SZ7.5 (GLOVE) ×6 IMPLANT
GLOVE BIOGEL PI IND STRL 7.0 (GLOVE) ×3 IMPLANT
GLOVE BIOGEL PI INDICATOR 7.0 (GLOVE) ×6
KIT ACCESSORY DA VINCI DISP (KITS) ×2
KIT ACCESSORY DVNC DISP (KITS) ×1 IMPLANT
LEGGING LITHOTOMY PAIR STRL (DRAPES) ×3 IMPLANT
LIQUID BAND (GAUZE/BANDAGES/DRESSINGS) ×3 IMPLANT
NEEDLE INSUFFLATION 150MM (ENDOMECHANICALS) ×3 IMPLANT
OCCLUDER COLPOPNEUMO (BALLOONS) IMPLANT
PACK ROBOT WH (CUSTOM PROCEDURE TRAY) ×3 IMPLANT
PACK ROBOTIC GOWN (GOWN DISPOSABLE) ×3 IMPLANT
PAD PREP 24X48 CUFFED NSTRL (MISCELLANEOUS) ×6 IMPLANT
PAD TRENDELENBURG POSITION (MISCELLANEOUS) ×3 IMPLANT
SET CYSTO W/LG BORE CLAMP LF (SET/KITS/TRAYS/PACK) IMPLANT
SET IRRIG TUBING LAPAROSCOPIC (IRRIGATION / IRRIGATOR) ×3 IMPLANT
SET TRI-LUMEN FLTR TB AIRSEAL (TUBING) ×2 IMPLANT
SPONGE GAUZE 2X2 8PLY STER LF (GAUZE/BANDAGES/DRESSINGS) ×1
SPONGE GAUZE 2X2 8PLY STRL LF (GAUZE/BANDAGES/DRESSINGS) ×1 IMPLANT
SUT VIC AB 0 CT1 27 (SUTURE) ×6
SUT VIC AB 0 CT1 27XBRD ANBCTR (SUTURE) ×2 IMPLANT
SUT VICRYL 0 UR6 27IN ABS (SUTURE) ×3 IMPLANT
SUT VICRYL RAPIDE 4/0 PS 2 (SUTURE) ×6 IMPLANT
SUT VLOC 180 0 9IN  GS21 (SUTURE) ×2
SUT VLOC 180 0 9IN GS21 (SUTURE) IMPLANT
SYR 50ML LL SCALE MARK (SYRINGE) ×3 IMPLANT
SYRINGE 10CC LL (SYRINGE) ×3 IMPLANT
TIP RUMI ORANGE 6.7MMX12CM (TIP) IMPLANT
TIP UTERINE 5.1X6CM LAV DISP (MISCELLANEOUS) IMPLANT
TIP UTERINE 6.7X10CM GRN DISP (MISCELLANEOUS) ×2 IMPLANT
TIP UTERINE 6.7X6CM WHT DISP (MISCELLANEOUS) ×2 IMPLANT
TIP UTERINE 6.7X8CM BLUE DISP (MISCELLANEOUS) IMPLANT
TOWEL OR 17X24 6PK STRL BLUE (TOWEL DISPOSABLE) ×9 IMPLANT
TROCAR DISP BLADELESS 8 DVNC (TROCAR) ×1 IMPLANT
TROCAR DISP BLADELESS 8MM (TROCAR) ×2
TROCAR OPTI TIP 5M 100M (ENDOMECHANICALS) IMPLANT
TROCAR PORT AIRSEAL 5X120 (TROCAR) IMPLANT
TROCAR XCEL 12X100 BLDLESS (ENDOMECHANICALS) IMPLANT
TROCAR Z-THREAD 12X150 (TROCAR) ×3 IMPLANT
WATER STERILE IRR 1000ML POUR (IV SOLUTION) ×9 IMPLANT

## 2015-05-02 NOTE — Op Note (Signed)
05/02/2015  10:10 AM  PATIENT:  Loretta Dawson  50 y.o. female  PRE-OPERATIVE DIAGNOSIS:  Dysmenorrhea, Menorrhagia  POST-OPERATIVE DIAGNOSIS:  Dysmenorrhea, Menorrhagia Fibroids Sigmoid bowel adhesions Enterocele PROCEDURE:  Procedure(s): ROBOTIC ASSISTED TOTAL HYSTERECTOMY BILATERAL SALPINGO OOPHORECTOMY LYSIS OF SIGMOID ADHESIONS MCCALL CUL DE PLASTY  SURGEON:  Surgeon(s): Brien Few, MD  ASSISTANTSDellis Filbert, MD   ANESTHESIA:   local and general  ESTIMATED BLOOD LOSS: LESS THAN 50CC  DRAINS: Urinary Catheter (Foley)   LOCAL MEDICATIONS USED:  MARCAINE    and Amount: 20 ml  SPECIMEN:  Source of Specimen:  UTERUS , CERVIX , TUBES AND OVARIES  DISPOSITION OF SPECIMEN:  PATHOLOGY  COUNTS:  YES  DICTATION #: [id number]  PLAN OF CARE: 23 HR EXTENDED  PATIENT DISPOSITION:  PACU - hemodynamically stable.

## 2015-05-02 NOTE — Transfer of Care (Signed)
Immediate Anesthesia Transfer of Care Note  Patient: Loretta Dawson  Procedure(s) Performed: Procedure(s) with comments: ROBOTIC ASSISTED TOTAL HYSTERECTOMY WITH BILATERAL SALPINGO OOPHORECTOMY (Bilateral) - 3 hrs. Destenie Ingber TO RNFA RYAN POPE THE ROBOT REP WILL BE HERE FOR THE VESSEL SEALER  Patient Location: PACU  Anesthesia Type:General  Level of Consciousness: awake, alert  and oriented  Airway & Oxygen Therapy: Patient Spontanous Breathing and Patient connected to nasal cannula oxygen  Post-op Assessment: Report given to RN  Post vital signs: Reviewed  Last Vitals:  Filed Vitals:   05/02/15 0655  BP: 111/83  Pulse: 97  Temp: 36.8 C  Resp: 18    Complications: No apparent anesthesia complications

## 2015-05-02 NOTE — Progress Notes (Signed)
Patient ID: Loretta Dawson, female   DOB: 1965-12-09, 50 y.o.   MRN: [id number] Patient seen and examined. Consent witnessed and signed. No changes noted. Update completed.

## 2015-05-02 NOTE — Anesthesia Preprocedure Evaluation (Addendum)
Anesthesia Evaluation  Patient identified by MRN, date of birth, ID band Patient awake    Reviewed: Allergy & Precautions, NPO status , Patient's Chart, lab work & pertinent test results  History of Anesthesia Complications Negative for: history of anesthetic complications  Airway Mallampati: II  TM Distance: >3 FB Neck ROM: Full    Dental no notable dental hx. (+) Dental Advisory Given, Chipped,    Pulmonary neg pulmonary ROS, former smoker,    Pulmonary exam normal breath sounds clear to auscultation       Cardiovascular negative cardio ROS Normal cardiovascular exam Rhythm:Regular Rate:Normal     Neuro/Psych  Headaches, PSYCHIATRIC DISORDERS Anxiety Depression    GI/Hepatic negative GI ROS, Neg liver ROS,   Endo/Other  Hypothyroidism   Renal/GU negative Renal ROS  negative genitourinary   Musculoskeletal  (+) Arthritis ,   Abdominal   Peds negative pediatric ROS (+)  Hematology negative hematology ROS (+)   Anesthesia Other Findings   Reproductive/Obstetrics negative OB ROS                            Anesthesia Physical Anesthesia Plan  ASA: II  Anesthesia Plan: General   Post-op Pain Management:    Induction: Intravenous  Airway Management Planned: Oral ETT  Additional Equipment:   Intra-op Plan:   Post-operative Plan: Extubation in OR  Informed Consent: I have reviewed the patients History and Physical, chart, labs and discussed the procedure including the risks, benefits and alternatives for the proposed anesthesia with the patient or authorized representative who has indicated his/her understanding and acceptance.   Dental advisory given  Plan Discussed with: CRNA  Anesthesia Plan Comments:         Anesthesia Quick Evaluation

## 2015-05-02 NOTE — Anesthesia Procedure Notes (Signed)
Procedure Name: Intubation Date/Time: 05/02/2015 8:02 AM Performed by: Talbot Grumbling Pre-anesthesia Checklist: Patient identified, Emergency Drugs available, Suction available and Patient being monitored Patient Re-evaluated:Patient Re-evaluated prior to inductionOxygen Delivery Method: Circle system utilized Preoxygenation: Pre-oxygenation with 100% oxygen Intubation Type: IV induction Ventilation: Mask ventilation without difficulty Laryngoscope Size: Glidescope (elective use of) Grade View: Grade I Tube type: Oral Tube size: 7.0 mm Number of attempts: 1 Airway Equipment and Method: Stylet Placement Confirmation: ETT inserted through vocal cords under direct vision,  positive ETCO2 and breath sounds checked- equal and bilateral Secured at: 21 cm Tube secured with: Tape Dental Injury: Teeth and Oropharynx as per pre-operative assessment

## 2015-05-03 DIAGNOSIS — D252 Subserosal leiomyoma of uterus: Secondary | ICD-10-CM | POA: Diagnosis not present

## 2015-05-03 LAB — CBC
HCT: 32.2 % — ABNORMAL LOW (ref 36.0–46.0)
Hemoglobin: 11.1 g/dL — ABNORMAL LOW (ref 12.0–15.0)
MCH: 30 pg (ref 26.0–34.0)
MCHC: 34.5 g/dL (ref 30.0–36.0)
MCV: 87 fL (ref 78.0–100.0)
PLATELETS: 301 10*3/uL (ref 150–400)
RBC: 3.7 MIL/uL — ABNORMAL LOW (ref 3.87–5.11)
RDW: 15.1 % (ref 11.5–15.5)
WBC: 15.8 10*3/uL — AB (ref 4.0–10.5)

## 2015-05-03 LAB — BASIC METABOLIC PANEL
ANION GAP: 6 (ref 5–15)
BUN: 7 mg/dL (ref 6–20)
CALCIUM: 8.9 mg/dL (ref 8.9–10.3)
CO2: 30 mmol/L (ref 22–32)
Chloride: 102 mmol/L (ref 101–111)
Creatinine, Ser: 0.48 mg/dL (ref 0.44–1.00)
GLUCOSE: 119 mg/dL — AB (ref 65–99)
Potassium: 4 mmol/L (ref 3.5–5.1)
SODIUM: 138 mmol/L (ref 135–145)

## 2015-05-03 MED ORDER — OXYCODONE-ACETAMINOPHEN 5-325 MG PO TABS: 1.0000 | ORAL_TABLET | ORAL | Status: DC | PRN

## 2015-05-03 MED ORDER — TRAMADOL HCL 50 MG PO TABS: 50.0000 mg | ORAL_TABLET | Freq: Four times a day (QID) | ORAL | Status: DC | PRN

## 2015-05-03 NOTE — Progress Notes (Signed)
1 Day Post-Op Procedure(s) (LRB): ROBOTIC ASSISTED TOTAL HYSTERECTOMY WITH BILATERAL SALPINGO OOPHORECTOMY (Bilateral)  Subjective: Patient reports nausea, incisional pain, tolerating PO, + flatus and no problems voiding.    Objective: BP 109/69 mmHg  Pulse 89  Temp(Src) 97.9 F (36.6 C) (Axillary)  Resp 17  Ht 5\' 3"  (1.6 m)  Wt 64.638 kg (142 lb 8 oz)  BMI 25.25 kg/m2  SpO2 95%  CBC    Component Value Date/Time   WBC 15.8* 05/03/2015 0531   RBC 3.70* 05/03/2015 0531   HGB 11.1* 05/03/2015 0531   HCT 32.2* 05/03/2015 0531   PLT 301 05/03/2015 0531   MCV 87.0 05/03/2015 0531   MCH 30.0 05/03/2015 0531   MCHC 34.5 05/03/2015 0531   RDW 15.1 05/03/2015 0531   LYMPHSABS  05/31/2010 0505    1.4 CORRECTED ON 05/12 AT 0817: PREVIOUSLY REPORTED AS 0.7   MONOABS * 05/31/2010 0505    1.2 CORRECTED ON 05/12 AT 0817: PREVIOUSLY REPORTED AS 0.7   EOSABS 0.0 05/31/2010 0505   BASOSABS 0.0 05/31/2010 0505     I have reviewed patient's vital signs, intake and output, medications and labs.  General: alert, cooperative and appears stated age Resp: clear to auscultation bilaterally and normal percussion bilaterally Cardio: regular rate and rhythm, S1, S2 normal, no murmur, click, rub or gallop and normal apical impulse GI: soft, non-tender; bowel sounds normal; no masses,  no organomegaly and incision: clean, dry and intact Extremities: extremities normal, atraumatic, no cyanosis or edema and Homans sign is negative, no sign of DVT Vaginal Bleeding: minimal  Assessment: s/p Procedure(s) with comments: ROBOTIC ASSISTED TOTAL HYSTERECTOMY WITH BILATERAL SALPINGO OOPHORECTOMY (Bilateral) - 3 hrs. BETH TO RNFA RYAN POPE THE ROBOT REP WILL BE HERE FOR THE VESSEL SEALER: stable, progressing well and tolerating diet  Plan: Advance diet Encourage ambulation Advance to PO medication Discontinue IV fluids Discharge home     Michelena Culmer J 05/03/2015, 9:51 AM

## 2015-05-03 NOTE — Progress Notes (Signed)
Discharge teaching complete. Pt understood all information and did not have any questions. Pt ambulated out of the hospital and discharged home to family.  

## 2015-05-03 NOTE — Anesthesia Postprocedure Evaluation (Signed)
Anesthesia Post Note  Patient: Loretta Dawson  Procedure(s) Performed: Procedure(s) (LRB): ROBOTIC ASSISTED TOTAL HYSTERECTOMY WITH BILATERAL SALPINGO OOPHORECTOMY (Bilateral)  Patient location during evaluation: [facility] Anesthesia Type: General Level of consciousness: awake, awake and alert and oriented Pain management: pain level controlled Vital Signs Assessment: post-procedure vital signs reviewed and stable Respiratory status: spontaneous breathing, nonlabored ventilation and respiratory function stable Cardiovascular status: stable Postop Assessment: no backache, no signs of nausea or vomiting and adequate PO intake Anesthetic complications: no Comments: Patient stated that she had a migraine today but that it has persisted since Thursday. She is taking meds and it has helped. Plans on being d/c today and she will speak to Dr. Ronita Hipps about treating migraines.    Last Vitals:  Filed Vitals:   05/03/15 0547 05/03/15 0559  BP: 109/69   Pulse: 89   Temp: 36.6 C   Resp: 16 17    Last Pain:  Filed Vitals:   05/03/15 0823  PainSc: 2                  Jaishaun Mcnab

## 2015-05-03 NOTE — Op Note (Signed)
NAMEGRACI, Dawson               ACCOUNT NO.:  [id number]  MEDICAL RECORD NO.:  [id number]  LOCATION:  9304                          FACILITY:  [facility]  PHYSICIAN:  Lovenia Kim, M.D.DATE OF BIRTH:  September 03, 1965  DATE OF PROCEDURE: DATE OF DISCHARGE:                              OPERATIVE REPORT   PREOPERATIVE DIAGNOSES:  Dysmenorrhea, abnormal uterine bleeding with normal endometrial biopsy, and pelvic pain, symptomatic fibroids.  POSTOPERATIVE DIAGNOSES:  Dysmenorrhea, abnormal uterine bleeding with normal endometrial biopsy, and pelvic pain, symptomatic fibroids plus sigmoid adhesions.  PROCEDURE:  Research officer, trade union assisted total laparoscopic hysterectomy, bilateral salpingo-oophorectomy, lysis of sigmoid adhesions,  McCall culdoplasty.  SURGEON:  Lovenia Kim, MD  ASSISTANTDellis Filbert, MD.  ANESTHESIA:  General.  ESTIMATED BLOOD LOSS:  Less than 50 mL.  COMPLICATIONS:  None.  DRAINS:  Foley.  COUNTS:  Correct.  DISPOSITION:  Patient to recovery room in good condition.  BRIEF OPERATIVE NOTE:  After being apprised of risks of anesthesia, infection, bleeding, injury to surrounding organs, possible need for repair, delayed versus immediate complications to include bowel and bladder injury, possible need for repair, the patient was brought to the operating room, was administered general anesthetic without complications, prepped and draped in usual sterile fashion and RUMI retractor placed vaginally and tied to the cervix using 0 Vicryl suture. At this time, an umbilical incision made with a scalpel.  Veress needle placed opening pressure -2, 3 L CO2 insufflated without difficulty. Trocar was placed atraumatically.  Pictures taken.  Normal liver gallbladder bed.  There was also adhesions of the cecal mesentery to the anterior abdominal wall, probably from her previous appendectomy in addition, sigmoid adhesions and left adnexa.  Small subserosal fibroids were noted.   Questionable enterocele.  Normal anterior cul-de-sac.  At this time, 8 mm trocar left and right 5 mm trocar.  On the left, AirSeal initiated.  The robot was docked in a standard fashion after establishing deep Trendelenburg position.  Placement of the vessel sealer on the left and the EndoShears on the right was done.  The robotic portion of procedure was then initiated whereby retroperitoneal space was entered on the left, ureter was identified on the medial leaf of the peritoneum.  The infundibulopelvic ligament is skeletonized and cauterized using the vessel sealer and divided.  The broad ligament was dissected down to the level of the uterocervical junction.  The round ligament was opened sharply.  The uterine vessels on the left were skeletonized.  The bladder flap was developed sharply.  Sigmoid adhesions were previously adhesed and the mesenteric adhesions to the anterior wall were lysed sharply using the Endo Shears as well.  On the right side, the infundibulopelvic ligament was skeletonized by retroperitoneal approach and divided using the vessel sealer. Retroperitoneal space was further developed.  Ureter was identified. The posterior leaf of the broad ligament was developed sharply down to the level of the cervicovaginal junction.  The round ligament was opened, divided, and the uterine arteries were skeletonized on the right.  The bladder flap was completely developed.  At this time, the PK forceps were placed and uterine vessels were divided in the standard fashion.  The cervicovaginal junction  was well delineated and the specimen was detached circumferentially using monopolar cautery.  The specimen was retracted into the vagina and the incision was closed vaginally and the vaginal incision was closed using 0 V-Loc suture in a continuous running fashion.  A second imbricating layer was placed. McCall culdoplasty suture placed.  Needle removed.  Irrigation was accomplished.   Urine was clear.  Ureters were seen peristalsing normally and bilaterally.  All instruments removed under direct visualization. CO2 released.  Positive pressure applied.  Robot undocked and incisions closed using 0 Vicryl and 3-0 Monocryl.  Marcaine solution placed. Dressings placed.  Vaginal exam reveals well-approximated vaginal cuff, well-supported vaginal cuff.  The patient tolerated the procedure well, was awakened and transferred to recovery in good condition.     Lovenia Kim, M.D.     RJT/MEDQ  D:  05/02/2015  T:  05/03/2015  Job:  [id number]

## 2015-05-06 ENCOUNTER — Encounter (HOSPITAL_COMMUNITY): Payer: Self-pay | Admitting: Obstetrics and Gynecology

## 2015-05-24 ENCOUNTER — Emergency Department (HOSPITAL_COMMUNITY)
Admission: EM | Admit: 2015-05-24 | Discharge: 2015-05-25 | Disposition: A | Discharge status: Home / Self Care | Payer: BLUE CROSS/BLUE SHIELD | Source: Home / Self Care | Attending: Emergency Medicine | Admitting: Emergency Medicine

## 2015-05-24 ENCOUNTER — Encounter (HOSPITAL_COMMUNITY): Payer: Self-pay | Admitting: *Deleted

## 2015-05-24 DIAGNOSIS — K589 Irritable bowel syndrome without diarrhea: Secondary | ICD-10-CM | POA: Diagnosis not present

## 2015-05-24 DIAGNOSIS — Z79899 Other long term (current) drug therapy: Secondary | ICD-10-CM

## 2015-05-24 DIAGNOSIS — Z90710 Acquired absence of both cervix and uterus: Secondary | ICD-10-CM | POA: Insufficient documentation

## 2015-05-24 DIAGNOSIS — K5900 Constipation, unspecified: Secondary | ICD-10-CM

## 2015-05-24 DIAGNOSIS — E785 Hyperlipidemia, unspecified: Secondary | ICD-10-CM

## 2015-05-24 DIAGNOSIS — Z862 Personal history of diseases of the blood and blood-forming organs and certain disorders involving the immune mechanism: Secondary | ICD-10-CM | POA: Insufficient documentation

## 2015-05-24 DIAGNOSIS — N939 Abnormal uterine and vaginal bleeding, unspecified: Secondary | ICD-10-CM | POA: Insufficient documentation

## 2015-05-24 DIAGNOSIS — F329 Major depressive disorder, single episode, unspecified: Secondary | ICD-10-CM

## 2015-05-24 DIAGNOSIS — G43909 Migraine, unspecified, not intractable, without status migrainosus: Secondary | ICD-10-CM | POA: Insufficient documentation

## 2015-05-24 DIAGNOSIS — Z86010 Personal history of colonic polyps: Secondary | ICD-10-CM | POA: Insufficient documentation

## 2015-05-24 DIAGNOSIS — F419 Anxiety disorder, unspecified: Secondary | ICD-10-CM | POA: Insufficient documentation

## 2015-05-24 DIAGNOSIS — Z8619 Personal history of other infectious and parasitic diseases: Secondary | ICD-10-CM

## 2015-05-24 DIAGNOSIS — M1990 Unspecified osteoarthritis, unspecified site: Secondary | ICD-10-CM | POA: Insufficient documentation

## 2015-05-24 DIAGNOSIS — Z87891 Personal history of nicotine dependence: Secondary | ICD-10-CM | POA: Diagnosis not present

## 2015-05-24 DIAGNOSIS — Z87440 Personal history of urinary (tract) infections: Secondary | ICD-10-CM | POA: Insufficient documentation

## 2015-05-24 DIAGNOSIS — N99821 Postprocedural hemorrhage and hematoma of a genitourinary system organ or structure following other procedure: Secondary | ICD-10-CM | POA: Diagnosis not present

## 2015-05-24 DIAGNOSIS — E039 Hypothyroidism, unspecified: Secondary | ICD-10-CM | POA: Diagnosis not present

## 2015-05-24 LAB — URINE MICROSCOPIC-ADD ON

## 2015-05-24 LAB — CBC WITH DIFFERENTIAL/PLATELET
BASOS ABS: 0.1 10*3/uL (ref 0.0–0.1)
Basophils Relative: 1 %
EOS ABS: 0.2 10*3/uL (ref 0.0–0.7)
EOS PCT: 2 %
HCT: 35.2 % — ABNORMAL LOW (ref 36.0–46.0)
Hemoglobin: 11.7 g/dL — ABNORMAL LOW (ref 12.0–15.0)
LYMPHS PCT: 37 %
Lymphs Abs: 3.2 10*3/uL (ref 0.7–4.0)
MCH: 29 pg (ref 26.0–34.0)
MCHC: 33.2 g/dL (ref 30.0–36.0)
MCV: 87.3 fL (ref 78.0–100.0)
Monocytes Absolute: 0.7 10*3/uL (ref 0.1–1.0)
Monocytes Relative: 8 %
Neutro Abs: 4.6 10*3/uL (ref 1.7–7.7)
Neutrophils Relative %: 52 %
Platelets: 413 10*3/uL — ABNORMAL HIGH (ref 150–400)
RBC: 4.03 MIL/uL (ref 3.87–5.11)
RDW: 14.7 % (ref 11.5–15.5)
WBC: 8.7 10*3/uL (ref 4.0–10.5)

## 2015-05-24 LAB — URINALYSIS, ROUTINE W REFLEX MICROSCOPIC
Glucose, UA: NEGATIVE mg/dL
Ketones, ur: 40 mg/dL — AB
Nitrite: POSITIVE — AB
Specific Gravity, Urine: 1.011 (ref 1.005–1.030)
pH: 6.5 (ref 5.0–8.0)

## 2015-05-24 LAB — BASIC METABOLIC PANEL
ANION GAP: 11 (ref 5–15)
BUN: 11 mg/dL (ref 6–20)
CHLORIDE: 103 mmol/L (ref 101–111)
CO2: 24 mmol/L (ref 22–32)
Calcium: 9.8 mg/dL (ref 8.9–10.3)
Creatinine, Ser: 0.63 mg/dL (ref 0.44–1.00)
GFR calc non Af Amer: >60 mL/min (ref >60)
GLUCOSE: 93 mg/dL (ref 65–99)
POTASSIUM: 3.6 mmol/L (ref 3.5–5.1)
Sodium: 138 mmol/L (ref 135–145)

## 2015-05-24 MED ORDER — LORAZEPAM 2 MG/ML IJ SOLN
1.0000 mg | Freq: Once | INTRAMUSCULAR | Status: AC
  Administered 2015-05-24: 1 mg via INTRAVENOUS
  Filled 2015-05-24: qty 1

## 2015-05-24 MED ORDER — SODIUM CHLORIDE 0.9 % IV BOLUS (SEPSIS)
1000.0000 mL | Freq: Once | INTRAVENOUS | Status: AC
  Administered 2015-05-24: 1000 mL via INTRAVENOUS

## 2015-05-24 NOTE — ED Notes (Signed)
Per Dr Zenia Resides, please hold off sending urine until patient is seen by provider.

## 2015-05-24 NOTE — ED Provider Notes (Signed)
CSN: [id number]     Arrival date & time 05/24/15  2009 History   First MD Initiated Contact with Patient 05/24/15 2054     Chief Complaint  Patient presents with  . Hematuria     (Consider location/radiation/quality/duration/timing/severity/associated sxs/prior Treatment) The history is provided by the patient and medical records. No language interpreter was used.     Loretta Dawson is a 50 y.o. female  with a hx of Thyroid disease, migraine, UTI, IBS, arthritis presents to the Emergency Department complaining of acute, persistent vaginal bleeding onset around 6:30 PM tonight. Patient with robotic assisted total hysterectomy with bilateral salpingo-oophorectomy on 05/02/2015 by Dr. Ronita Hipps of [facility].  She reports mild hematuria over the last several weeks. She has been evaluated for urinary tract infection 2 with urine cultures. She's been treated with Macrobid however both cultures have been negative. She denies abdominal pain. She denies fever, chills, nausea, vomiting. She states she was standing in the grocery store tonight when she "felt a gush of blood.  She reports it soaked her underwear and ran down her legs  Last oral intake was 3:30pm.     Past Medical History  Diagnosis Date  . Thyroid disease   . Depression   . Hyperlipidemia     patient denies  . Migraine   . Chronic constipation   . UTI (lower urinary tract infection)   . Colon polyp   . Irritable bowel syndrome     extreme  . Hypotension   . Vasovagal response   . Hypothyroidism   . Vertigo   . Arthritis   . Anemia   . Candida infection   . Complication of anesthesia     vasovagal response  . Anxiety    Past Surgical History  Procedure Laterality Date  . Cystectomy    . Breast enhancement surgery    . Appendectomy    . Incisional breast biopsy    . Colonoscopy    . Upper gi endoscopy    . Wisdom tooth extraction    . Robotic assisted total hysterectomy with bilateral salpingo oopherectomy  Bilateral 05/02/2015    Procedure: ROBOTIC ASSISTED TOTAL HYSTERECTOMY WITH BILATERAL SALPINGO OOPHORECTOMY;  Surgeon: Brien Few, MD;  Location: [facility];  Service: Gynecology;  Laterality: Bilateral;  3 hrs. BETH TO RNFA RYAN POPE THE ROBOT REP WILL BE HERE FOR THE VESSEL SEALER   Family History  Problem Relation Age of Onset  . Breast cancer Mother   . Hypertension Mother   . Thyroid disease Mother   . Cancer Father   . Diabetes Father   . Hyperlipidemia Father   . Thyroid disease Father   . Asthma    . COPD    . Alcoholism    . Heart disease    . Other      Lipid disorder   Social History  Substance Use Topics  . Smoking status: Former Research scientist (life sciences)  . Smokeless tobacco: Never Used  . Alcohol Use: No   OB History    No data available     Review of Systems  Constitutional: Negative for fever, diaphoresis, appetite change, fatigue and unexpected weight change.  HENT: Negative for mouth sores.   Eyes: Negative for visual disturbance.  Respiratory: Negative for cough, chest tightness, shortness of breath and wheezing.   Cardiovascular: Negative for chest pain.  Gastrointestinal: Negative for nausea, vomiting, abdominal pain, diarrhea and constipation.  Endocrine: Negative for polydipsia, polyphagia and polyuria.  Genitourinary: Positive for vaginal bleeding.  Negative for dysuria, urgency, frequency, hematuria, vaginal discharge and vaginal pain.  Musculoskeletal: Negative for back pain and neck stiffness.  Skin: Negative for rash.  Allergic/Immunologic: Negative for immunocompromised state.  Neurological: Negative for syncope, light-headedness and headaches.  Hematological: Does not bruise/bleed easily.  Psychiatric/Behavioral: Negative for sleep disturbance. The patient is not nervous/anxious.       Allergies  Codeine; Sulfa antibiotics; Other; and Ciprofloxacin  Home Medications   Prior to Admission medications   Medication Sig Start Date End Date Taking?  Authorizing Provider  CINNAMON PO Take 1,000 mg by mouth daily.    Yes Historical Provider, MD  COLOSTRUM PO Take 1 capsule by mouth 2 (two) times daily. colostrum bovine   Yes Historical Provider, MD  DULoxetine (CYMBALTA) 30 MG capsule Take 90 mg by mouth daily.   Yes Historical Provider, MD  estazolam (PROSOM) 2 MG tablet Take 1-2 mg by mouth at bedtime. 04/23/15  Yes Historical Provider, MD  fexofenadine (ALLEGRA) 180 MG tablet Take 180 mg by mouth daily. Reported on 05/24/2015   Yes Historical Provider, MD  lamoTRIgine (LAMICTAL) 200 MG tablet Take 400 mg by mouth daily.    Yes Historical Provider, MD  magnesium gluconate (MAGONATE) 500 MG tablet Take 500 mg by mouth at bedtime.    Yes Historical Provider, MD  NATURE-THROID 130 MG tablet Take 130 mg by mouth daily.  02/25/15  Yes Historical Provider, MD  Nutritional Supplements (CANDIDA COMPLEX PO) Take 2-4 capsules by mouth 2 (two) times daily with a meal.   Yes Historical Provider, MD  Omega-3 Fatty Acids (FISH OIL) 1000 MG CAPS Take 1,000 mg by mouth at bedtime.    Yes Historical Provider, MD  OVER THE COUNTER MEDICATION Take 1 capsule by mouth 2 (two) times daily. Patient take bovine colostrum.   Yes Historical Provider, MD  oxyCODONE-acetaminophen (PERCOCET/ROXICET) 5-325 MG tablet Take 1-2 tablets by mouth every 3 (three) hours as needed for severe pain. 05/03/15  Yes Brien Few, MD  phentermine (ADIPEX-P) 37.5 MG tablet Take 37.5 mg by mouth daily as needed (for appetite suppression.).    Yes Historical Provider, MD  polyethylene glycol (MIRALAX / GLYCOLAX) packet Take 17 g by mouth daily as needed for mild constipation.   Yes Historical Provider, MD  traMADol (ULTRAM) 50 MG tablet Take 1-2 tablets (50-100 mg total) by mouth every 6 (six) hours as needed for moderate pain. 05/03/15  Yes Brien Few, MD  valACYclovir (VALTREX) 500 MG tablet Take 500 mg by mouth daily.   Yes Historical Provider, MD  Vitamin D-Vitamin K (VITAMIN K2-VITAMIN  D3 PO) Take 1 capsule by mouth at bedtime.    Yes Historical Provider, MD   BP 112/75 mmHg  Pulse 74  Temp(Src) 98.3 F (36.8 C) (Oral)  Resp 20  Ht 5\' 3"  (1.6 m)  Wt 62.596 kg  BMI 24.45 kg/m2  SpO2 99%  LMP  (LMP Unknown) Physical Exam  Constitutional: She appears well-developed and well-nourished. No distress.  HENT:  Head: Normocephalic and atraumatic.  Eyes: Conjunctivae are normal.  Neck: Normal range of motion.  Cardiovascular: Normal rate, regular rhythm, normal heart sounds and intact distal pulses.   No murmur heard. Pulmonary/Chest: Effort normal and breath sounds normal. No respiratory distress. She has no wheezes.  Abdominal: Soft. Bowel sounds are normal. There is no tenderness. There is no rebound and no guarding. Hernia confirmed negative in the right inguinal area and confirmed negative in the left inguinal area.  Genitourinary: No labial fusion. There is  no rash, tenderness or lesion on the right labia. There is no rash, tenderness or lesion on the left labia. Right adnexum displays no mass, no tenderness and no fullness. Left adnexum displays no mass, no tenderness and no fullness. There is bleeding in the vagina. No erythema or tenderness in the vagina. No foreign body around the vagina. No signs of injury around the vagina. No vaginal discharge found.  Vaginal vault is full of right red blood and large clot Cervix is surgically absent Unable to totally visualize the vaginal cuff incision; blood appears to be coming from the left portion of the incision  Musculoskeletal: Normal range of motion. She exhibits no edema.  Lymphadenopathy:       Right: No inguinal adenopathy present.       Left: No inguinal adenopathy present.  Neurological: She is alert.  Skin: Skin is warm and dry. She is not diaphoretic. No erythema.  Psychiatric: She has a normal mood and affect.  Nursing note and vitals reviewed.   ED Course  Procedures (including critical care time) Labs  Review Labs Reviewed  URINALYSIS, ROUTINE W REFLEX MICROSCOPIC (NOT AT [facility]) - Abnormal; Notable for the following:    Color, Urine RED (*)    APPearance CLOUDY (*)    Hgb urine dipstick LARGE (*)    Bilirubin Urine LARGE (*)    Ketones, ur 40 (*)    Protein, ur >300 (*)    Nitrite POSITIVE (*)    Leukocytes, UA MODERATE (*)    All other components within normal limits  CBC WITH DIFFERENTIAL/PLATELET - Abnormal; Notable for the following:    Hemoglobin 11.7 (*)    HCT 35.2 (*)    Platelets 413 (*)    All other components within normal limits  URINE MICROSCOPIC-ADD ON - Abnormal; Notable for the following:    Squamous Epithelial / LPF 0-5 (*)    Bacteria, UA MANY (*)    All other components within normal limits  BASIC METABOLIC PANEL    MDM   Final diagnoses:  Vaginal bleeding  S/P hysterectomy   Zeb Comfort presents with vaginal bleeding 3 weeks s/p hysterectomy.  Patient recently had a follow-up with her OB/GYN who stated that her incision was healing well.  Vaginal exam shows moderate amount of blood in the vaginal vault with large clot.  Incision is not well-visualized but there is no swelling or induration to suggest infection. No purulent drainage.  No evidence of urinary tract infection on UA today.  No significant anemia noted.  Chest pain or shortness of breath. No tachycardia or hypotension.  10:35 PM Discussed with Dr. Ronita Hipps He recommends vaginal packing. Patient is to call the office tomorrow morning to schedule a time to meet him or one of his office staff at [facility] hospital for removal of the packing and further evaluation of the incision.   1:02 AM Vagina is packed with kerlex and she remains without pain.  No anemia.  D/c in stable condition.  Discussed plan with patient and husband. They state understanding and are in agreement with the plan. They understand reasons to return to the MAU at the emergency department before discussion with Dr. Ronita Hipps on in the  morning.  BP 118/72 mmHg  Pulse 76  Temp(Src) 98.3 F (36.8 C) (Oral)  Resp 18  Ht 5\' 3"  (1.6 m)  Wt 62.596 kg  BMI 24.45 kg/m2  SpO2 98%  LMP  (LMP Unknown)   Abigail Butts, PA-C 05/25/15 [id number]  Orlie Dakin, MD 05/25/15 1501

## 2015-05-24 NOTE — ED Notes (Signed)
Pt reports she had a hysterectomy 3 weeks ago. She says that around [place] she had onset of bleeding (thinks it is urinary). Reports when she urinated she had bleeding. She has had two urinalysis/cutlure completed since having her hysterectomy and "they were negative", but has had two rounds of macrobid. Denies fevers.

## 2015-05-24 NOTE — ED Notes (Signed)
Pt ambulated to the restroom, she states she "gushed blood, not vaginal" when she stood up. Tech noted the pt had blood all on the bathroom floor. Urine sample that the pt obtained was brought to room for physician to see prior to sending to lab.

## 2015-05-25 ENCOUNTER — Inpatient Hospital Stay (HOSPITAL_COMMUNITY)
Admission: AD | Admit: 2015-05-25 | Discharge: 2015-05-25 | Disposition: A | Discharge status: Home / Self Care | Payer: BLUE CROSS/BLUE SHIELD | Source: Ambulatory Visit | Attending: Obstetrics and Gynecology | Admitting: Obstetrics and Gynecology

## 2015-05-25 ENCOUNTER — Encounter (HOSPITAL_COMMUNITY): Payer: Self-pay | Admitting: *Deleted

## 2015-05-25 DIAGNOSIS — Z87891 Personal history of nicotine dependence: Secondary | ICD-10-CM | POA: Insufficient documentation

## 2015-05-25 DIAGNOSIS — E039 Hypothyroidism, unspecified: Secondary | ICD-10-CM | POA: Insufficient documentation

## 2015-05-25 DIAGNOSIS — K589 Irritable bowel syndrome without diarrhea: Secondary | ICD-10-CM | POA: Insufficient documentation

## 2015-05-25 DIAGNOSIS — F329 Major depressive disorder, single episode, unspecified: Secondary | ICD-10-CM | POA: Insufficient documentation

## 2015-05-25 DIAGNOSIS — N99821 Postprocedural hemorrhage and hematoma of a genitourinary system organ or structure following other procedure: Secondary | ICD-10-CM | POA: Insufficient documentation

## 2015-05-25 NOTE — MAU Note (Signed)
Post op hysterectomy with bleeding.

## 2015-05-25 NOTE — Discharge Instructions (Signed)
1. Medications: usual home medications 2. Treatment: rest, drink plenty of fluids,  3. Follow Up: Please call Dr. Ronita Hipps in the AM to set up a time to meet him and the MAU for further evaluation. Please go straight to the MAU tonight if you bleed through your vaginal packing.

## 2015-05-25 NOTE — Discharge Instructions (Signed)
Minimal activity today. Resume prescribed post op activity tomorrow. Keep your scheduled appointment for follow-up with Dr. Ronita Hipps.

## 2015-05-25 NOTE — MAU Provider Note (Signed)
History   History of uncomplicated TLH now with vaginal cuff bleeding. Seen in [place] ER last night and packing placed. No bleeding.  CSN: [id number]  Arrival date and time: 05/25/15 [id number]   First Provider Initiated Contact with Patient 05/25/15 1016      Chief Complaint  Patient presents with  . Post-op Problem   HPI  Pertinent Gynecological History: Menses: post-menopausal Bleeding: na Contraception: none DES exposure: denies Blood transfusions: none Sexually transmitted diseases: no past history Previous GYN Procedures: na  Last mammogram: normal Date: na Last pap: normal Date: na   Past Medical History  Diagnosis Date  . Thyroid disease   . Depression   . Hyperlipidemia     patient denies  . Migraine   . Chronic constipation   . UTI (lower urinary tract infection)   . Colon polyp   . Irritable bowel syndrome     extreme  . Hypotension   . Vasovagal response   . Hypothyroidism   . Vertigo   . Arthritis   . Anemia   . Candida infection   . Complication of anesthesia     vasovagal response  . Anxiety     Past Surgical History  Procedure Laterality Date  . Cystectomy    . Breast enhancement surgery    . Appendectomy    . Incisional breast biopsy    . Colonoscopy    . Upper gi endoscopy    . Wisdom tooth extraction    . Robotic assisted total hysterectomy with bilateral salpingo oopherectomy Bilateral 05/02/2015    Procedure: ROBOTIC ASSISTED TOTAL HYSTERECTOMY WITH BILATERAL SALPINGO OOPHORECTOMY;  Surgeon: Brien Few, MD;  Location: [facility];  Service: Gynecology;  Laterality: Bilateral;  3 hrs. BETH TO RNFA RYAN POPE THE ROBOT REP WILL BE HERE FOR THE VESSEL SEALER  . Abdominal hysterectomy      Family History  Problem Relation Age of Onset  . Breast cancer Mother   . Hypertension Mother   . Thyroid disease Mother   . Cancer Father   . Diabetes Father   . Hyperlipidemia Father   . Thyroid disease Father   . Asthma    . COPD    .  Alcoholism    . Heart disease    . Other      Lipid disorder    Social History  Substance Use Topics  . Smoking status: Former Research scientist (life sciences)  . Smokeless tobacco: Never Used  . Alcohol Use: No    Allergies:  Allergies  Allergen Reactions  . Codeine Nausea And Vomiting    Has taken hydrocodone-acetaminophen without issue.  . Sulfa Antibiotics Rash  . Other Other (See Comments)    Sutures caused an infection  . Ciprofloxacin Rash    Prescriptions prior to admission  Medication Sig Dispense Refill Last Dose  . CINNAMON PO Take 1,000 mg by mouth daily.    05/24/2015 at Unknown time  . COLOSTRUM PO Take 1 capsule by mouth 2 (two) times daily. colostrum bovine     . DULoxetine (CYMBALTA) 30 MG capsule Take 90 mg by mouth daily.   05/24/2015 at Unknown time  . estazolam (PROSOM) 2 MG tablet Take 1-2 mg by mouth at bedtime.   05/23/2015 at Unknown time  . fexofenadine (ALLEGRA) 180 MG tablet Take 180 mg by mouth daily. Reported on 05/24/2015   05/24/2015 at Unknown time  . lamoTRIgine (LAMICTAL) 200 MG tablet Take 400 mg by mouth daily.    05/24/2015 at Unknown time  .  magnesium gluconate (MAGONATE) 500 MG tablet Take 500 mg by mouth at bedtime.    05/23/2015 at Unknown time  . NATURE-THROID 130 MG tablet Take 130 mg by mouth daily.    05/24/2015 at Unknown time  . Nutritional Supplements (CANDIDA COMPLEX PO) Take 2-4 capsules by mouth 2 (two) times daily with a meal.   05/24/2015 at Unknown time  . Omega-3 Fatty Acids (FISH OIL) 1000 MG CAPS Take 1,000 mg by mouth at bedtime.    05/23/2015 at Unknown time  . OVER THE COUNTER MEDICATION Take 1 capsule by mouth 2 (two) times daily. Patient take bovine colostrum.   05/24/2015 at Unknown time  . oxyCODONE-acetaminophen (PERCOCET/ROXICET) 5-325 MG tablet Take 1-2 tablets by mouth every 3 (three) hours as needed for severe pain. 30 tablet 0 2 weeks ago  . phentermine (ADIPEX-P) 37.5 MG tablet Take 37.5 mg by mouth daily as needed (for appetite suppression.).    Past  Week at Unknown time  . polyethylene glycol (MIRALAX / GLYCOLAX) packet Take 17 g by mouth daily as needed for mild constipation.   Past Week at Unknown time  . traMADol (ULTRAM) 50 MG tablet Take 1-2 tablets (50-100 mg total) by mouth every 6 (six) hours as needed for moderate pain. 30 tablet 0 Past Week at Unknown time  . valACYclovir (VALTREX) 500 MG tablet Take 500 mg by mouth daily.   05/24/2015 at Unknown time  . Vitamin D-Vitamin K (VITAMIN K2-VITAMIN D3 PO) Take 1 capsule by mouth at bedtime.    05/23/2015 at Unknown time    ROS Physical Exam  WDWN WF NAD  HEENT nl Neck : supple Lgs: CTA CV: RRR ABD : NT, incisions well healed Pelvic: Packing removed Minimal blood tinged kerlex Vaginal cuff intact. No bleeding. Silver nitrate applied. Ext: neg c/c e Neuro: non focal Skin : intact  Blood pressure 107/78, pulse 83, temperature 97.9 F (36.6 C), temperature source Oral, resp. rate 18.  Physical Exam  MAU Course  Procedures  MDM na  Assessment and Plan  Post TLH cuff bleeding s/p successful packing DC home Routine fu Loretta Dawson J 05/25/2015, 10:36 AM Hiso

## 2015-07-10 ENCOUNTER — Other Ambulatory Visit: Payer: Self-pay | Admitting: Dermatology

## 2016-06-12 DIAGNOSIS — N63 Unspecified lump in unspecified breast: Secondary | ICD-10-CM

## 2016-06-12 HISTORY — DX: Unspecified lump in unspecified breast: N63.0

## 2016-06-17 ENCOUNTER — Ambulatory Visit
Admission: RE | Admit: 2016-06-17 | Discharge: 2016-06-17 | Disposition: A | Discharge status: Home / Self Care | Payer: BLUE CROSS/BLUE SHIELD | Source: Ambulatory Visit | Attending: Obstetrics and Gynecology | Admitting: Obstetrics and Gynecology

## 2016-06-17 ENCOUNTER — Other Ambulatory Visit: Payer: Self-pay | Admitting: Obstetrics and Gynecology

## 2016-06-17 DIAGNOSIS — N63 Unspecified lump in unspecified breast: Secondary | ICD-10-CM

## 2016-06-17 HISTORY — DX: Unspecified lump in unspecified breast: N63.0

## 2016-09-07 ENCOUNTER — Ambulatory Visit (INDEPENDENT_AMBULATORY_CARE_PROVIDER_SITE_OTHER): Payer: BLUE CROSS/BLUE SHIELD | Admitting: Family Medicine

## 2016-09-07 ENCOUNTER — Encounter: Payer: Self-pay | Admitting: Family Medicine

## 2016-09-07 DIAGNOSIS — F509 Eating disorder, unspecified: Secondary | ICD-10-CM

## 2016-09-07 NOTE — Patient Instructions (Addendum)
-   We have both physiolgical and psychological appetite.  The former is the easiest to handle.    Principles of appetite management: 1. Eat at least 3 REAL meals and 1-2 snacks per day.  Aim for no more than 5 hours between eating.  Eat breakfast within one hour of getting up.   A REAL meal includes at least some protein, some starch, and vegetables and/or fruit.    - OR:  Would you serve this to a guest in your home, and call it a meal?  2. Make sure each meal includes a meaningful amount of protein.   Emotional appetite management:  1. Use the eating decisions algorithm provided today.    - Write about your experiences using this tool.  Bring to follow-up.    - AT FOLLOW-UP, WE WILL EXPLORE AT LEAST ONE MORE SPECIFIC TOOL FOR MANAGING NON-HUNGER EATING AS WELL AS ACCOUNTABILITY.

## 2016-09-07 NOTE — Progress Notes (Signed)
Medical Nutrition Therapy:  Appt start time: 1330 end time:  1430. Gastroenterologist: Johny Chess, MD PCP: Shon Baton, MD [facility]: Audria Nine, MD Therapist: Lennart Pall, PhD  Assessment:  Primary concerns today: sweets cravings and GI problems (unspecified eating d/o; F50.9).  Loretta Dawson) started having severe GI problems in high school.  She had extreme pain and bloating; repeated celiac testing has been negative, diagnosis of IBS, but nothing more definitive.  Went to [facility] in [place] 4-5 yr ago for food allergy testing, which revealed sensitivities to egg, brewer's and baker's yeast, as well as a parasitic infection.  She took Nystatin for yeast overgrowth, then a med for the parasite.  Around this time, she stopped consuming yeast foods (baked goods), and symptoms improved dramatically.  Still avoids yeast, and tries to avoid sugar, but she "absolutely craves sugar," which she describes as an uncontrollable urge.    Loretta started Vyvanse ~6 wks ago, which she feels has helped with sugar cravings.  She is NOT hungry when she gets up usually.   Cravings are most problematic after dinner.    History of disordered eating:  Loretta Dawson has been very weight-focused all her life, and in the past 3-4 yrs, she has especially struggled with sugar cravings and binge eating.  (Also tends to over-eat peanut butter.)    Lives with her husband, and 9-YO step-daughter lives with them every other weekend.  When the step-daughter is there, they do have sit-down dinner meals, but often otherwise has a smoothie for dinner.    Loretta Dawson has difficulty falling asleep, but sleeps well once she is asleep when taking Prosom.  Loretta Dawson feels she has adequate sleep most of the time.  Goes to bed ~12:30 AM, and gets up ~ 8:15 most days.    Started to see Dr. Eulas Post and Noemi Chapel in 2010 for depression and alcohol dependence.  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    Learning  Readiness: Ready  Usual eating pattern includes 2 meals and 1 snacks per day. Frequent foods and beverages include protein powder meal (~3 X wk), Twizzlers (1 pkg 3-4 X wk pre-Vyvanse), peanut butter (2-4 tbsp/time), vanilla Grk yogurt, coffee (almond or coconut creamer), water, Monster drinks (1/day).  Avoided foods include yeast products, yellow cheese (b/c of annatto), milk, ice cream, pumpkin seeds, almonds, some high-fiber foods or those with large seeds.   Usual physical activity includes walking 4 miles 4 X wk, 15 min water exercise 2 X wk; limited d/t history of knee injuries.  Also tore gastroc muscle in a Zumba class a few yrs ago.    24-hr recall: (Up at 10 AM) B (10:30 AM)-  1 c coffee, 1 tbsp almond creamer, water Snk ( AM)-    L (2 PM)-  10-12 corn chips, 3 tbsp c guacamole, water Snk (3 PM)-  Water, 16 oz Monster drink Snk (5:30)-  8 GF crackers, 1 oz cheddar cheese, water D (8:30 PM)-  3/4 c brn rice, 1 1/2 c shrimp stir-fry (coconut oil), scallions, car's, cel, sug snap peas, water Snk ( PM)-  2-3 sugar-free cinnamon candies Typical day? No.  More typical is a smoothie for dinner; usually has brunch vs bkfst and lunch; usually has some fruit during the day.    Progress Towards Goal(s):  In progress.   Nutritional Diagnosis:  NB-1.5 Disordered eating pattern As related to sugar cravings and occasional binges .  As evidenced by self-report of uncontrollable urges  to consume sugary foods.    Intervention:  Nutrition counseling.  Handouts given during visit include:  AVS  Eating Decisions Algorithm  Demonstrated degree of understanding via:  Teach Back  Barriers to learning/adherence to lifestyle change: Longstanding all-or-none thinking and behaviors.    Monitoring/Evaluation:  Dietary intake, exercise, and body weight in 8 week(s). (No appts available before then.)

## 2016-11-05 ENCOUNTER — Ambulatory Visit: Payer: BLUE CROSS/BLUE SHIELD | Admitting: Family Medicine

## 2018-02-04 DIAGNOSIS — M23232 Derangement of other medial meniscus due to old tear or injury, left knee: Secondary | ICD-10-CM | POA: Diagnosis not present

## 2018-02-04 DIAGNOSIS — M94262 Chondromalacia, left knee: Secondary | ICD-10-CM | POA: Diagnosis not present

## 2018-02-04 DIAGNOSIS — M23262 Derangement of other lateral meniscus due to old tear or injury, left knee: Secondary | ICD-10-CM | POA: Diagnosis not present

## 2018-02-08 DIAGNOSIS — M23232 Derangement of other medial meniscus due to old tear or injury, left knee: Secondary | ICD-10-CM | POA: Diagnosis not present

## 2018-02-08 DIAGNOSIS — M94262 Chondromalacia, left knee: Secondary | ICD-10-CM | POA: Diagnosis not present

## 2018-02-08 DIAGNOSIS — M23262 Derangement of other lateral meniscus due to old tear or injury, left knee: Secondary | ICD-10-CM | POA: Diagnosis not present

## 2018-02-11 DIAGNOSIS — M23262 Derangement of other lateral meniscus due to old tear or injury, left knee: Secondary | ICD-10-CM | POA: Diagnosis not present

## 2018-02-11 DIAGNOSIS — M23232 Derangement of other medial meniscus due to old tear or injury, left knee: Secondary | ICD-10-CM | POA: Diagnosis not present

## 2018-02-11 DIAGNOSIS — M94262 Chondromalacia, left knee: Secondary | ICD-10-CM | POA: Diagnosis not present

## 2018-02-15 DIAGNOSIS — M23232 Derangement of other medial meniscus due to old tear or injury, left knee: Secondary | ICD-10-CM | POA: Diagnosis not present

## 2018-02-15 DIAGNOSIS — M94262 Chondromalacia, left knee: Secondary | ICD-10-CM | POA: Diagnosis not present

## 2018-02-15 DIAGNOSIS — M23262 Derangement of other lateral meniscus due to old tear or injury, left knee: Secondary | ICD-10-CM | POA: Diagnosis not present

## 2018-02-18 DIAGNOSIS — M23262 Derangement of other lateral meniscus due to old tear or injury, left knee: Secondary | ICD-10-CM | POA: Diagnosis not present

## 2018-02-18 DIAGNOSIS — M94262 Chondromalacia, left knee: Secondary | ICD-10-CM | POA: Diagnosis not present

## 2018-02-18 DIAGNOSIS — M23232 Derangement of other medial meniscus due to old tear or injury, left knee: Secondary | ICD-10-CM | POA: Diagnosis not present

## 2018-02-21 DIAGNOSIS — M23262 Derangement of other lateral meniscus due to old tear or injury, left knee: Secondary | ICD-10-CM | POA: Diagnosis not present

## 2018-02-21 DIAGNOSIS — M94262 Chondromalacia, left knee: Secondary | ICD-10-CM | POA: Diagnosis not present

## 2018-02-21 DIAGNOSIS — M23232 Derangement of other medial meniscus due to old tear or injury, left knee: Secondary | ICD-10-CM | POA: Diagnosis not present

## 2018-02-24 DIAGNOSIS — R82998 Other abnormal findings in urine: Secondary | ICD-10-CM | POA: Diagnosis not present

## 2018-02-24 DIAGNOSIS — E038 Other specified hypothyroidism: Secondary | ICD-10-CM | POA: Diagnosis not present

## 2018-02-24 DIAGNOSIS — Z0000 Encounter for general adult medical examination without abnormal findings: Secondary | ICD-10-CM | POA: Diagnosis not present

## 2018-02-24 DIAGNOSIS — R5381 Other malaise: Secondary | ICD-10-CM | POA: Diagnosis not present

## 2018-03-01 DIAGNOSIS — M23262 Derangement of other lateral meniscus due to old tear or injury, left knee: Secondary | ICD-10-CM | POA: Diagnosis not present

## 2018-03-01 DIAGNOSIS — M94262 Chondromalacia, left knee: Secondary | ICD-10-CM | POA: Diagnosis not present

## 2018-03-01 DIAGNOSIS — M23232 Derangement of other medial meniscus due to old tear or injury, left knee: Secondary | ICD-10-CM | POA: Diagnosis not present

## 2018-03-03 DIAGNOSIS — E7849 Other hyperlipidemia: Secondary | ICD-10-CM | POA: Diagnosis not present

## 2018-03-03 DIAGNOSIS — E038 Other specified hypothyroidism: Secondary | ICD-10-CM | POA: Diagnosis not present

## 2018-03-03 DIAGNOSIS — E663 Overweight: Secondary | ICD-10-CM | POA: Diagnosis not present

## 2018-03-03 DIAGNOSIS — M179 Osteoarthritis of knee, unspecified: Secondary | ICD-10-CM | POA: Diagnosis not present

## 2018-03-07 ENCOUNTER — Other Ambulatory Visit: Payer: Self-pay | Admitting: Internal Medicine

## 2018-03-07 DIAGNOSIS — E785 Hyperlipidemia, unspecified: Secondary | ICD-10-CM

## 2018-03-09 DIAGNOSIS — M23232 Derangement of other medial meniscus due to old tear or injury, left knee: Secondary | ICD-10-CM | POA: Diagnosis not present

## 2018-03-09 DIAGNOSIS — M94262 Chondromalacia, left knee: Secondary | ICD-10-CM | POA: Diagnosis not present

## 2018-03-09 DIAGNOSIS — M23262 Derangement of other lateral meniscus due to old tear or injury, left knee: Secondary | ICD-10-CM | POA: Diagnosis not present

## 2018-03-25 ENCOUNTER — Ambulatory Visit
Admission: RE | Admit: 2018-03-25 | Discharge: 2018-03-25 | Disposition: A | Discharge status: Home / Self Care | Payer: BLUE CROSS/BLUE SHIELD | Source: Ambulatory Visit | Attending: Internal Medicine | Admitting: Internal Medicine

## 2018-03-25 DIAGNOSIS — E785 Hyperlipidemia, unspecified: Secondary | ICD-10-CM

## 2018-12-07 ENCOUNTER — Encounter (HOSPITAL_COMMUNITY): Payer: Self-pay

## 2018-12-07 NOTE — Patient Instructions (Signed)
DUE TO COVID-19 ONLY ONE VISITOR IS ALLOWED TO COME WITH YOU AND STAY IN THE WAITING ROOM ONLY DURING PRE OP AND PROCEDURE DAY OF SURGERY. THE 1 VISITOR MAY VISIT WITH YOU AFTER SURGERY IN YOUR PRIVATE ROOM DURING VISITING HOURS ONLY!  YOU NEED TO HAVE A COVID 19 TEST ON_______ @_______ , THIS TEST MUST BE DONE BEFORE SURGERY, COME  [place] , [id number].  ([place]) ONCE YOUR COVID TEST IS COMPLETED, PLEASE BEGIN THE QUARANTINE INSTRUCTIONS AS OUTLINED IN YOUR HANDOUT.                MARYANA GIGNAC  12/07/2018   Your procedure is scheduled on: 12-19-18   Report to [facility] Main  Entrance   Report to admitting at           [place]  AM     Call this number if you have problems the morning of surgery [phone]    Remember: NO SOLID FOOD AFTER MIDNIGHT THE NIGHT PRIOR TO SURGERY. NOTHING BY MOUTH EXCEPT CLEAR LIQUIDS UNTIL   0650 am  . PLEASE FINISH ENSURE DRINK PER SURGEON ORDER  WHICH NEEDS TO BE COMPLETED AT      0650 am then nothing by mouth .     CLEAR LIQUID DIET   Foods Allowed                                                                                                               Foods Excluded  Coffee and tea, regular and decaf    NO creamer                                                          liquids that you cannot  Plain Jell-O any favor except red or purple                                                                see through such as: Fruit ices (not with fruit pulp)                                                                                       milk, soups, orange juice  Iced Popsicles  All solid food Carbonated beverages, regular and diet                                    Cranberry, grape and apple juices Sports drinks like Gatorade Lightly seasoned clear broth or consume(fat free) Sugar, honey  syrup   _____________________________________________________________________    BRUSH YOUR TEETH MORNING OF SURGERY AND RINSE YOUR MOUTH OUT, NO CHEWING GUM CANDY OR MINTS.     Take these medicines the morning of surgery with A SIP OF WATER: zyrtec, cymbalta, crestor, thyroid, geodon, zonegran                                 You may not have any metal on your body including hair pins and              piercings  Do not wear jewelry, make-up, lotions, powders or perfumes, deodorant             Do not wear nail polish on your fingernails.  Do not shave  48 hours prior to surgery.     Do not bring valuables to the hospital. [place].  Contacts, dentures or bridgework may not be worn into surgery. Marland Kitchen      Special Instructions: N/A              Please read over the following fact sheets you were given: _____________________________________________________________________             [facility] - Preparing for Surgery Before surgery, you can play an important role.  Because skin is not sterile, your skin needs to be as free of germs as possible.  You can reduce the number of germs on your skin by washing with CHG (chlorahexidine gluconate) soap before surgery.  CHG is an antiseptic cleaner which kills germs and bonds with the skin to continue killing germs even after washing. Please DO NOT use if you have an allergy to CHG or antibacterial soaps.  If your skin becomes reddened/irritated stop using the CHG and inform your nurse when you arrive at Short Stay. Do not shave (including legs and underarms) for at least 48 hours prior to the first CHG shower.  You may shave your face/neck. Please follow these instructions carefully:  1.  Shower with CHG Soap the night before surgery and the  morning of Surgery.  2.  If you choose to wash your hair, wash your hair first as usual with your  normal  shampoo.  3.  After you shampoo, rinse your  hair and body thoroughly to remove the  shampoo.                           4.  Use CHG as you would any other liquid soap.  You can apply chg directly  to the skin and wash                       Gently with a scrungie or clean washcloth.  5.  Apply the CHG Soap to your body ONLY FROM THE NECK DOWN.   Do not use on face/ open  Wound or open sores. Avoid contact with eyes, ears mouth and genitals (private parts).                       Wash face,  Genitals (private parts) with your normal soap.             6.  Wash thoroughly, paying special attention to the area where your surgery  will be performed.  7.  Thoroughly rinse your body with warm water from the neck down.  8.  DO NOT shower/wash with your normal soap after using and rinsing off  the CHG Soap.                9.  Pat yourself dry with a clean towel.            10.  Wear clean pajamas.            11.  Place clean sheets on your bed the night of your first shower and do not  sleep with pets. Day of Surgery : Do not apply any lotions/deodorants the morning of surgery.  Please wear clean clothes to the hospital/surgery center.  FAILURE TO FOLLOW THESE INSTRUCTIONS MAY RESULT IN THE CANCELLATION OF YOUR SURGERY PATIENT SIGNATURE_________________________________  NURSE SIGNATURE__________________________________  ________________________________________________________________________   Adam Phenix  An incentive spirometer is a tool that can help keep your lungs clear and active. This tool measures how well you are filling your lungs with each breath. Taking long deep breaths may help reverse or decrease the chance of developing breathing (pulmonary) problems (especially infection) following:  A long period of time when you are unable to move or be active. BEFORE THE PROCEDURE   If the spirometer includes an indicator to show your best effort, your nurse or respiratory therapist will set it to a  desired goal.  If possible, sit up straight or lean slightly forward. Try not to slouch.  Hold the incentive spirometer in an upright position. INSTRUCTIONS FOR USE  1. Sit on the edge of your bed if possible, or sit up as far as you can in bed or on a chair. 2. Hold the incentive spirometer in an upright position. 3. Breathe out normally. 4. Place the mouthpiece in your mouth and seal your lips tightly around it. 5. Breathe in slowly and as deeply as possible, raising the piston or the ball toward the top of the column. 6. Hold your breath for 3-5 seconds or for as long as possible. Allow the piston or ball to fall to the bottom of the column. 7. Remove the mouthpiece from your mouth and breathe out normally. 8. Rest for a few seconds and repeat Steps 1 through 7 at least 10 times every 1-2 hours when you are awake. Take your time and take a few normal breaths between deep breaths. 9. The spirometer may include an indicator to show your best effort. Use the indicator as a goal to work toward during each repetition. 10. After each set of 10 deep breaths, practice coughing to be sure your lungs are clear. If you have an incision (the cut made at the time of surgery), support your incision when coughing by placing a pillow or rolled up towels firmly against it. Once you are able to get out of bed, walk around indoors and cough well. You may stop using the incentive spirometer when instructed by your caregiver.  RISKS AND COMPLICATIONS  Take your time so you do not get  dizzy or light-headed.  If you are in pain, you may need to take or ask for pain medication before doing incentive spirometry. It is harder to take a deep breath if you are having pain. AFTER USE  Rest and breathe slowly and easily.  It can be helpful to keep track of a log of your progress. Your caregiver can provide you with a simple table to help with this. If you are using the spirometer at home, follow these  instructions: [place] IF:   You are having difficultly using the spirometer.  You have trouble using the spirometer as often as instructed.  Your pain medication is not giving enough relief while using the spirometer.  You develop fever of 100.5 F (38.1 C) or higher. SEEK IMMEDIATE MEDICAL CARE IF:   You cough up bloody sputum that had not been present before.  You develop fever of 102 F (38.9 C) or greater.  You develop worsening pain at or near the incision site. MAKE SURE YOU:   Understand these instructions.  Will watch your condition.  Will get help right away if you are not doing well or get worse. Document Released: 05/18/2006 Document Revised: 03/30/2011 Document Reviewed: 07/19/2006 [facility] Patient Information 2014 [place], [place].   ________________________________________________________________________

## 2018-12-12 ENCOUNTER — Other Ambulatory Visit: Payer: Self-pay

## 2018-12-12 ENCOUNTER — Encounter (HOSPITAL_COMMUNITY)
Admission: RE | Admit: 2018-12-12 | Discharge: 2018-12-12 | Disposition: A | Discharge status: Home / Self Care | Payer: Commercial Managed Care - PPO | Source: Ambulatory Visit | Attending: Orthopedic Surgery | Admitting: Orthopedic Surgery

## 2018-12-12 ENCOUNTER — Encounter (HOSPITAL_COMMUNITY): Payer: Self-pay

## 2018-12-12 DIAGNOSIS — Z01818 Encounter for other preprocedural examination: Secondary | ICD-10-CM | POA: Diagnosis present

## 2018-12-12 DIAGNOSIS — M1712 Unilateral primary osteoarthritis, left knee: Secondary | ICD-10-CM | POA: Insufficient documentation

## 2018-12-12 HISTORY — DX: Personal history of urinary calculi: Z87.442

## 2018-12-12 LAB — COMPREHENSIVE METABOLIC PANEL
ALT: 24 U/L (ref 0–44)
AST: 23 U/L (ref 15–41)
Albumin: 4.4 g/dL (ref 3.5–5.0)
Alkaline Phosphatase: 73 U/L (ref 38–126)
Anion gap: 12 (ref 5–15)
BUN: 15 mg/dL (ref 6–20)
CO2: 22 mmol/L (ref 22–32)
Calcium: 9 mg/dL (ref 8.9–10.3)
Chloride: 103 mmol/L (ref 98–111)
Creatinine, Ser: 0.53 mg/dL (ref 0.44–1.00)
GFR calc Af Amer: >60 mL/min (ref >60)
GFR calc non Af Amer: >60 mL/min (ref >60)
Glucose, Bld: 86 mg/dL (ref 70–99)
Potassium: 3.8 mmol/L (ref 3.5–5.1)
Sodium: 137 mmol/L (ref 135–145)
Total Bilirubin: 0.9 mg/dL (ref 0.3–1.2)
Total Protein: 7.4 g/dL (ref 6.5–8.1)

## 2018-12-12 LAB — SURGICAL PCR SCREEN
MRSA, PCR: NEGATIVE
Staphylococcus aureus: POSITIVE — AB

## 2018-12-12 LAB — CBC WITH DIFFERENTIAL/PLATELET
Abs Immature Granulocytes: 0.03 10*3/uL (ref 0.00–0.07)
Basophils Absolute: 0.1 10*3/uL (ref 0.0–0.1)
Basophils Relative: 1 %
Eosinophils Absolute: 0.1 10*3/uL (ref 0.0–0.5)
Eosinophils Relative: 1 %
HCT: 41.8 % (ref 36.0–46.0)
Hemoglobin: 13.8 g/dL (ref 12.0–15.0)
Immature Granulocytes: 0 %
Lymphocytes Relative: 28 %
Lymphs Abs: 2.8 10*3/uL (ref 0.7–4.0)
MCH: 33 pg (ref 26.0–34.0)
MCHC: 33 g/dL (ref 30.0–36.0)
MCV: 100 fL (ref 80.0–100.0)
Monocytes Absolute: 0.7 10*3/uL (ref 0.1–1.0)
Monocytes Relative: 7 %
Neutro Abs: 6.2 10*3/uL (ref 1.7–7.7)
Neutrophils Relative %: 63 %
Platelets: 309 10*3/uL (ref 150–400)
RBC: 4.18 MIL/uL (ref 3.87–5.11)
RDW: 12.4 % (ref 11.5–15.5)
WBC: 9.8 10*3/uL (ref 4.0–10.5)
nRBC: 0 % (ref 0.0–0.2)

## 2018-12-12 LAB — APTT: aPTT: 32 seconds (ref 24–36)

## 2018-12-12 LAB — PROTIME-INR
INR: 1 (ref 0.8–1.2)
Prothrombin Time: 13 seconds (ref 11.4–15.2)

## 2018-12-12 NOTE — H&P (Signed)
TOTAL KNEE ADMISSION H&P  Patient is being admitted for left total knee arthroplasty.  Subjective:  Chief Complaint:left knee pain.  HPI: Loretta Dawson, 53 y.o. female, has a history of pain and functional disability in the left knee due to arthritis and has failed non-surgical conservative treatments for greater than 12 weeks to includeNSAID's and/or analgesics, corticosteriod injections, viscosupplementation injections, flexibility and strengthening excercises, supervised PT with diminished ADL's post treatment, weight reduction as appropriate and activity modification.  Onset of symptoms was gradual, starting 7 years ago with gradually worsening course since that time. The patient noted prior procedures on the knee to include  arthroscopy and menisectomy on the left knee(s).  Patient currently rates pain in the left knee(s) at 10 out of 10 with activity. Patient has night pain, worsening of pain with activity and weight bearing, pain that interferes with activities of daily living, crepitus and joint swelling.  Patient has evidence of subchondral sclerosis, periarticular osteophytes and joint space narrowing by imaging studies.There is no active infection.  Patient Active Problem List   Diagnosis Date Noted  . Dysmenorrhea 05/02/2015  . Headache(784.0) 05/31/2013   Past Medical History:  Diagnosis Date  . Anemia   . Anxiety   . Arthritis   . Breast mass 06/12/2016   rt breast lump  . Candida infection   . Chronic constipation   . Colon polyp   . Depression   . History of kidney stones   . Hyperlipidemia    patient denies  . Hypotension   . Hypothyroidism   . Irritable bowel syndrome    extreme  . Migraine   . Thyroid disease   . UTI (lower urinary tract infection)   . Vasovagal response   . Vasovagal response    after taking IUD out  . Vertigo     Past Surgical History:  Procedure Laterality Date  . ABDOMINAL HYSTERECTOMY    . APPENDECTOMY    . BREAST ENHANCEMENT  SURGERY    . COLONOSCOPY    . CYSTECTOMY    . INCISIONAL BREAST BIOPSY     benign  . ROBOTIC ASSISTED TOTAL HYSTERECTOMY WITH BILATERAL SALPINGO OOPHERECTOMY Bilateral 05/02/2015   Procedure: ROBOTIC ASSISTED TOTAL HYSTERECTOMY WITH BILATERAL SALPINGO OOPHORECTOMY;  Surgeon: Brien Few, MD;  Location: [facility];  Service: Gynecology;  Laterality: Bilateral;  3 hrs. BETH TO RNFA RYAN POPE THE ROBOT REP WILL BE HERE FOR THE VESSEL SEALER  . UPPER GI ENDOSCOPY    . WISDOM TOOTH EXTRACTION      No current facility-administered medications for this encounter.    Current Outpatient Medications  Medication Sig Dispense Refill Last Dose  . Ascorbic Acid (VITAMIN C) 1000 MG tablet Take 1,000 mg by mouth daily.     . cetirizine (ZYRTEC ALLERGY) 10 MG tablet Take 10 mg by mouth daily.     . CHROMIUM-CINNAMON PO Take 2 capsules by mouth daily.      . Coenzyme Q10 (CO Q-10) 100 MG CAPS Take 100 mg by mouth daily.     . DULoxetine (CYMBALTA) 30 MG capsule Take 60 mg by mouth daily.      Marland Kitchen estazolam (PROSOM) 2 MG tablet Take 1-2 mg by mouth at bedtime.     . fluticasone (FLONASE ALLERGY RELIEF) 50 MCG/ACT nasal spray Place 2 sprays into both nostrils daily as needed for allergies.      Marland Kitchen gabapentin (NEURONTIN) 300 MG capsule Take 300 mg by mouth at bedtime.     . Garlic  1000 MG CAPS Take 1,000 mg by mouth daily.     Marland Kitchen lamoTRIgine (LAMICTAL) 200 MG tablet Take 200 mg by mouth at bedtime.      . Magnesium Gluconate 250 MG TABS Take 250 mg by mouth daily.      . methylphenidate 36 MG PO CR tablet Take 36 mg by mouth daily as needed (focus).      . Multiple Vitamin (MULTIVITAMIN) capsule Take 1 capsule by mouth daily.     . Omega-3 Fatty Acids (FISH OIL) 1200 MG CAPS Take 1,200 mg by mouth at bedtime.      . Probiotic Product (RESTORA) CAPS Take 1 capsule by mouth daily.      . rosuvastatin (CRESTOR) 5 MG tablet Take 5 mg by mouth daily.     Marland Kitchen thyroid (ARMOUR) 120 MG tablet Take 120 mg by mouth daily  before breakfast.     . TURMERIC CURCUMIN PO Take 1,300 mg by mouth daily.      . valACYclovir (VALTREX) 500 MG tablet Take 500 mg by mouth daily as needed.      Marland Kitchen VITAMIN D-VITAMIN K PO Take 1 tablet by mouth daily.     Marland Kitchen zinc gluconate 50 MG tablet Take 50 mg by mouth daily.     . ziprasidone (GEODON) 40 MG capsule Take 40 mg by mouth daily.     Marland Kitchen zonisamide (ZONEGRAN) 100 MG capsule Take 200 mg by mouth daily.     Marland Kitchen ALPRAZolam (XANAX) 0.5 MG tablet Take 0.5 mg by mouth 3 (three) times daily as needed for anxiety.      Allergies  Allergen Reactions  . Sulfa Antibiotics Rash  . Other Other (See Comments)    Sutures caused an infection  . Ciprofloxacin Rash    Social History   Tobacco Use  . Smoking status: Former Smoker    Types: Cigarettes  . Smokeless tobacco: Never Used  . Tobacco comment: quit 2018 , smoked off and on since 1990   Substance Use Topics  . Alcohol use: Yes    Alcohol/week: 1.0 standard drinks    Types: 1 Glasses of wine per week    Comment: weekly    Family History  Problem Relation Age of Onset  . Breast cancer Mother   . Hypertension Mother   . Thyroid disease Mother   . Cancer Father   . Diabetes Father   . Hyperlipidemia Father   . Thyroid disease Father   . Asthma Other   . COPD Other   . Alcoholism Other   . Heart disease Other   . Other Other        Lipid disorder     Review of Systems  Constitutional: Negative.   HENT: Negative.   Eyes: Negative.   Respiratory: Negative.   Cardiovascular: Negative.   Gastrointestinal: Negative.   Genitourinary: Negative.   Musculoskeletal: Positive for back pain and joint pain.  Skin: Negative.   Neurological: Positive for seizures.  Psychiatric/Behavioral: The patient is nervous/anxious.     Objective:  Physical Exam  Constitutional: She is oriented to person, place, and time. She appears well-developed and well-nourished.  HENT:  Head: Normocephalic and atraumatic.  Mouth/Throat:  Oropharynx is clear and moist.  Eyes: Pupils are equal, round, and reactive to light. EOM are normal.  Neck: Neck supple.  Cardiovascular: Normal rate.  Respiratory: Effort normal.  GI: Soft.  Genitourinary:    Genitourinary Comments: Not pertinent to current symptomatology therefore not examined.   Musculoskeletal:  Comments:  She is independently ambulatory with a significant flexed knee gait.  She has active range of motion -10 to 110 degrees of her left knee.  2+ crepitus.  2+ synovitis.  Medial and lateral joint line tenderness.  DNVI   Right knee has full range of motion without pain swelling or deformity  Neurological: She is alert and oriented to person, place, and time.  Skin: Skin is warm and dry.  Psychiatric: She has a normal mood and affect. Her behavior is normal.    Vital signs in last 24 hours: Temp:  [98.6 F (37 C)] 98.6 F (37 C) (11/23 1107) Pulse Rate:  [69] 69 (11/23 1107) Resp:  [18] 18 (11/23 1107) BP: (130)/(80) 130/80 (11/23 1107) SpO2:  [100 %] 100 % (11/23 1107) Weight:  [75 kg] 75 kg (11/23 1107)  Labs:   Estimated body mass index is 29.28 kg/m as calculated from the following:   Height as of 12/12/18: 5\' 3"  (1.6 m).   Weight as of 12/12/18: 75 kg.   Imaging Review Plain radiographs demonstrate severe degenerative joint disease of the left knee(s). The overall alignment issignificant varus. The bone quality appears to be good for age and reported activity level.      Assessment/Plan:  End stage arthritis, left knee   The patient history, physical examination, clinical judgment of the provider and imaging studies are consistent with end stage degenerative joint disease of the left knee(s) and total knee arthroplasty is deemed medically necessary. The treatment options including medical management, injection therapy arthroscopy and arthroplasty were discussed at length. The risks and benefits of total knee arthroplasty were presented and  reviewed. The risks due to aseptic loosening, infection, stiffness, patella tracking problems, thromboembolic complications and other imponderables were discussed. The patient acknowledged the explanation, agreed to proceed with the plan and consent was signed. Patient is being admitted for inpatient treatment for surgery, pain control, PT, OT, prophylactic antibiotics, VTE prophylaxis, progressive ambulation and ADL's and discharge planning. The patient is planning to be discharged home with home health services  MSSA POSITIVE  Anticipated LOS equal to or greater than 2 midnights due to - Age 11 and older with one or more of the following:  - Obesity  - Expected need for hospital services (PT, OT, Nursing) required for safe  discharge  - Anticipated need for postoperative skilled nursing care or inpatient rehab  - Active co-morbidities: None OR   - Unanticipated findings during/Post Surgery: None  - Patient is a high risk of re-admission due to: None   Patient is significantly deconditioned and has significant anxiety

## 2018-12-12 NOTE — Progress Notes (Signed)
PCP - Shon Baton Cardiologist -   Chest x-ray -  EKG - 12-12-18 epic Stress Test -  ECHO -  Cardiac Cath -  CT cardiac scoring 03-25-18 epic  Sleep Study -  CPAP -   Fasting Blood Sugar -  Checks Blood Sugar _____ times a day  Blood Thinner Instructions: Aspirin Instructions: Last Dose:  Anesthesia review:   Patient denies shortness of breath, fever, cough and chest pain at PAT appointment   NONE   Patient verbalized understanding of instructions that were given to them at the PAT appointment. Patient was also instructed that they will need to review over the PAT instructions again at home before surgery.

## 2018-12-13 LAB — URINE CULTURE: Culture: <10000 — AB

## 2018-12-16 ENCOUNTER — Other Ambulatory Visit (HOSPITAL_COMMUNITY)
Admission: RE | Admit: 2018-12-16 | Discharge: 2018-12-16 | Disposition: A | Discharge status: Home / Self Care | Payer: Commercial Managed Care - PPO | Source: Ambulatory Visit | Attending: Orthopedic Surgery | Admitting: Orthopedic Surgery

## 2018-12-16 ENCOUNTER — Other Ambulatory Visit (HOSPITAL_COMMUNITY): Payer: Commercial Managed Care - PPO

## 2018-12-16 DIAGNOSIS — Z01812 Encounter for preprocedural laboratory examination: Secondary | ICD-10-CM | POA: Insufficient documentation

## 2018-12-16 DIAGNOSIS — Z20828 Contact with and (suspected) exposure to other viral communicable diseases: Secondary | ICD-10-CM | POA: Insufficient documentation

## 2018-12-16 LAB — SARS CORONAVIRUS 2 (TAT 6-24 HRS): SARS Coronavirus 2: NEGATIVE

## 2018-12-16 NOTE — Anesthesia Preprocedure Evaluation (Addendum)
Anesthesia Evaluation  Patient identified by MRN, date of birth, ID band Patient awake    Reviewed: Allergy & Precautions, NPO status , Patient's Chart, lab work & pertinent test results  Airway Mallampati: II  TM Distance: >3 FB Neck ROM: Full    Dental no notable dental hx. (+) Teeth Intact, Dental Advisory Given   Pulmonary former smoker,    Pulmonary exam normal breath sounds clear to auscultation       Cardiovascular negative cardio ROS Normal cardiovascular exam Rhythm:Regular Rate:Normal     Neuro/Psych  Headaches, PSYCHIATRIC DISORDERS Depression    GI/Hepatic negative GI ROS,   Endo/Other  Hypothyroidism   Renal/GU K+ 3.8 Cr 0.53     Musculoskeletal   Abdominal   Peds  Hematology Hgb 13,8 Plt 309   Anesthesia Other Findings   Reproductive/Obstetrics                            Anesthesia Physical Anesthesia Plan  ASA: I  Anesthesia Plan: Spinal   Post-op Pain Management:  Regional for Post-op pain   Induction:   PONV Risk Score and Plan: Treatment may vary due to age or medical condition  Airway Management Planned: Nasal Cannula and Natural Airway  Additional Equipment: None  Intra-op Plan:   Post-operative Plan:   Informed Consent: I have reviewed the patients History and Physical, chart, labs and discussed the procedure including the risks, benefits and alternatives for the proposed anesthesia with the patient or authorized representative who has indicated his/her understanding and acceptance.     Dental advisory given  Plan Discussed with: CRNA  Anesthesia Plan Comments: (Sp w L adductor Canal block)      Anesthesia Quick Evaluation

## 2018-12-18 MED ORDER — BUPIVACAINE LIPOSOME 1.3 % IJ SUSP
20.0000 mL | INTRAMUSCULAR | Status: DC
  Filled 2018-12-18: qty 20

## 2018-12-19 ENCOUNTER — Inpatient Hospital Stay (HOSPITAL_COMMUNITY): Payer: Commercial Managed Care - PPO | Admitting: Physician Assistant

## 2018-12-19 ENCOUNTER — Inpatient Hospital Stay (HOSPITAL_COMMUNITY): Payer: Commercial Managed Care - PPO | Admitting: Anesthesiology

## 2018-12-19 ENCOUNTER — Encounter (HOSPITAL_COMMUNITY)
Admission: RE | Disposition: A | Discharge status: Home / Self Care | Payer: Self-pay | Source: Home / Self Care | Attending: Orthopedic Surgery

## 2018-12-19 ENCOUNTER — Other Ambulatory Visit: Payer: Self-pay

## 2018-12-19 ENCOUNTER — Inpatient Hospital Stay (HOSPITAL_COMMUNITY)
Admission: RE | Admit: 2018-12-19 | Discharge: 2018-12-20 | DRG: 470 | Disposition: A | Discharge status: Home Health | Payer: Commercial Managed Care - PPO | Attending: Orthopedic Surgery | Admitting: Orthopedic Surgery

## 2018-12-19 ENCOUNTER — Encounter (HOSPITAL_COMMUNITY): Payer: Self-pay | Admitting: *Deleted

## 2018-12-19 DIAGNOSIS — F419 Anxiety disorder, unspecified: Secondary | ICD-10-CM | POA: Diagnosis present

## 2018-12-19 DIAGNOSIS — Z881 Allergy status to other antibiotic agents status: Secondary | ICD-10-CM

## 2018-12-19 DIAGNOSIS — Z882 Allergy status to sulfonamides status: Secondary | ICD-10-CM

## 2018-12-19 DIAGNOSIS — M1712 Unilateral primary osteoarthritis, left knee: Secondary | ICD-10-CM | POA: Diagnosis present

## 2018-12-19 DIAGNOSIS — Z87891 Personal history of nicotine dependence: Secondary | ICD-10-CM

## 2018-12-19 DIAGNOSIS — G43909 Migraine, unspecified, not intractable, without status migrainosus: Secondary | ICD-10-CM | POA: Diagnosis present

## 2018-12-19 DIAGNOSIS — F329 Major depressive disorder, single episode, unspecified: Secondary | ICD-10-CM | POA: Diagnosis present

## 2018-12-19 DIAGNOSIS — E785 Hyperlipidemia, unspecified: Secondary | ICD-10-CM | POA: Diagnosis present

## 2018-12-19 DIAGNOSIS — Z803 Family history of malignant neoplasm of breast: Secondary | ICD-10-CM

## 2018-12-19 DIAGNOSIS — E669 Obesity, unspecified: Secondary | ICD-10-CM | POA: Diagnosis present

## 2018-12-19 DIAGNOSIS — Z79899 Other long term (current) drug therapy: Secondary | ICD-10-CM | POA: Diagnosis not present

## 2018-12-19 DIAGNOSIS — Z6829 Body mass index (BMI) 29.0-29.9, adult: Secondary | ICD-10-CM | POA: Diagnosis not present

## 2018-12-19 DIAGNOSIS — E039 Hypothyroidism, unspecified: Secondary | ICD-10-CM | POA: Diagnosis present

## 2018-12-19 DIAGNOSIS — Z20828 Contact with and (suspected) exposure to other viral communicable diseases: Secondary | ICD-10-CM | POA: Diagnosis present

## 2018-12-19 HISTORY — PX: TOTAL KNEE ARTHROPLASTY: SHX125

## 2018-12-19 SURGERY — ARTHROPLASTY, KNEE, TOTAL
Anesthesia: Spinal | Site: Knee | Laterality: Left

## 2018-12-19 MED ORDER — OXYCODONE HCL 5 MG PO TABS
5.0000 mg | ORAL_TABLET | ORAL | Status: DC | PRN
  Administered 2018-12-19 (×2): 5 mg via ORAL
  Administered 2018-12-19: 10 mg via ORAL
  Administered 2018-12-20: 5 mg via ORAL
  Administered 2018-12-20: 10 mg via ORAL
  Administered 2018-12-20: 5 mg via ORAL
  Filled 2018-12-19: qty 1
  Filled 2018-12-19 (×2): qty 2
  Filled 2018-12-19 (×3): qty 1

## 2018-12-19 MED ORDER — FENTANYL CITRATE (PF) 100 MCG/2ML IJ SOLN
INTRAMUSCULAR | Status: AC
  Administered 2018-12-19: 09:00:00 100 ug via INTRAVENOUS
  Filled 2018-12-19: qty 2

## 2018-12-19 MED ORDER — ZONISAMIDE 100 MG PO CAPS
200.0000 mg | ORAL_CAPSULE | Freq: Every day | ORAL | Status: DC
  Administered 2018-12-20: 200 mg via ORAL
  Filled 2018-12-19: qty 2

## 2018-12-19 MED ORDER — PROPOFOL 500 MG/50ML IV EMUL
INTRAVENOUS | Status: DC | PRN
  Administered 2018-12-19: 125 ug/kg/min via INTRAVENOUS

## 2018-12-19 MED ORDER — TRANEXAMIC ACID-NACL 1000-0.7 MG/100ML-% IV SOLN
1000.0000 mg | Freq: Once | INTRAVENOUS | Status: AC
  Administered 2018-12-19: 15:00:00 1000 mg via INTRAVENOUS
  Filled 2018-12-19: qty 100

## 2018-12-19 MED ORDER — BUPIVACAINE HCL 0.25 % IJ SOLN
INTRAMUSCULAR | Status: DC | PRN
  Administered 2018-12-19: 30 mL

## 2018-12-19 MED ORDER — MIDAZOLAM HCL 2 MG/2ML IJ SOLN
INTRAMUSCULAR | Status: AC
  Administered 2018-12-19: 09:00:00 2 mg via INTRAVENOUS
  Filled 2018-12-19: qty 2

## 2018-12-19 MED ORDER — SODIUM CHLORIDE (PF) 0.9 % IJ SOLN
INTRAMUSCULAR | Status: AC
  Filled 2018-12-19: qty 50

## 2018-12-19 MED ORDER — LACTATED RINGERS IV SOLN
INTRAVENOUS | Status: DC
  Administered 2018-12-19 (×2): via INTRAVENOUS

## 2018-12-19 MED ORDER — CEFAZOLIN SODIUM-DEXTROSE 2-4 GM/100ML-% IV SOLN
2.0000 g | Freq: Four times a day (QID) | INTRAVENOUS | Status: AC
  Administered 2018-12-19 (×2): 2 g via INTRAVENOUS
  Filled 2018-12-19 (×2): qty 100

## 2018-12-19 MED ORDER — ONDANSETRON HCL 4 MG PO TABS: 4.0000 mg | ORAL_TABLET | Freq: Four times a day (QID) | ORAL | Status: DC | PRN

## 2018-12-19 MED ORDER — BUPIVACAINE HCL (PF) 0.25 % IJ SOLN
INTRAMUSCULAR | Status: AC
  Filled 2018-12-19: qty 30

## 2018-12-19 MED ORDER — HYDROMORPHONE HCL 1 MG/ML IJ SOLN: 0.2500 mg | INTRAMUSCULAR | Status: DC | PRN

## 2018-12-19 MED ORDER — TEMAZEPAM 15 MG PO CAPS
30.0000 mg | ORAL_CAPSULE | Freq: Every day | ORAL | Status: DC
  Administered 2018-12-19: 22:00:00 30 mg via ORAL
  Filled 2018-12-19: qty 2

## 2018-12-19 MED ORDER — POVIDONE-IODINE 10 % EX SWAB: 2.0000 "application " | Freq: Once | CUTANEOUS | Status: DC

## 2018-12-19 MED ORDER — ONDANSETRON HCL 4 MG/2ML IJ SOLN: 4.0000 mg | Freq: Four times a day (QID) | INTRAMUSCULAR | Status: DC | PRN

## 2018-12-19 MED ORDER — MIDAZOLAM HCL 2 MG/2ML IJ SOLN
1.0000 mg | Freq: Once | INTRAMUSCULAR | Status: AC
  Administered 2018-12-19: 09:00:00 2 mg via INTRAVENOUS

## 2018-12-19 MED ORDER — MIDAZOLAM HCL 5 MG/5ML IJ SOLN
INTRAMUSCULAR | Status: DC | PRN
  Administered 2018-12-19: 2 mg via INTRAVENOUS

## 2018-12-19 MED ORDER — HYDROCODONE-ACETAMINOPHEN 7.5-325 MG PO TABS: 1.0000 | ORAL_TABLET | Freq: Once | ORAL | Status: DC | PRN

## 2018-12-19 MED ORDER — LAMOTRIGINE 100 MG PO TABS
200.0000 mg | ORAL_TABLET | Freq: Every day | ORAL | Status: DC
  Administered 2018-12-19: 22:00:00 200 mg via ORAL
  Filled 2018-12-19: qty 2

## 2018-12-19 MED ORDER — POLYETHYLENE GLYCOL 3350 17 G PO PACK
17.0000 g | PACK | Freq: Two times a day (BID) | ORAL | Status: DC
  Administered 2018-12-20: 17 g via ORAL
  Filled 2018-12-19: qty 1

## 2018-12-19 MED ORDER — LORATADINE 10 MG PO TABS
10.0000 mg | ORAL_TABLET | Freq: Every day | ORAL | Status: DC
  Administered 2018-12-19 – 2018-12-20 (×2): 10 mg via ORAL
  Filled 2018-12-19 (×2): qty 1

## 2018-12-19 MED ORDER — MEPERIDINE HCL 50 MG/ML IJ SOLN: 6.2500 mg | INTRAMUSCULAR | Status: DC | PRN

## 2018-12-19 MED ORDER — CHLORHEXIDINE GLUCONATE 4 % EX LIQD: 60.0000 mL | Freq: Once | CUTANEOUS | Status: DC

## 2018-12-19 MED ORDER — GABAPENTIN 300 MG PO CAPS
300.0000 mg | ORAL_CAPSULE | Freq: Every day | ORAL | Status: DC
  Administered 2018-12-19: 300 mg via ORAL
  Filled 2018-12-19: qty 1

## 2018-12-19 MED ORDER — PHENYLEPHRINE 40 MCG/ML (10ML) SYRINGE FOR IV PUSH (FOR BLOOD PRESSURE SUPPORT)
PREFILLED_SYRINGE | INTRAVENOUS | Status: AC
  Filled 2018-12-19: qty 10

## 2018-12-19 MED ORDER — BUPIVACAINE LIPOSOME 1.3 % IJ SUSP
INTRAMUSCULAR | Status: DC | PRN
  Administered 2018-12-19: 20 mL

## 2018-12-19 MED ORDER — TRANEXAMIC ACID-NACL 1000-0.7 MG/100ML-% IV SOLN
1000.0000 mg | INTRAVENOUS | Status: AC
  Administered 2018-12-19: 1000 mg via INTRAVENOUS
  Filled 2018-12-19: qty 100

## 2018-12-19 MED ORDER — ACETAMINOPHEN 500 MG PO TABS
1000.0000 mg | ORAL_TABLET | Freq: Four times a day (QID) | ORAL | Status: AC
  Administered 2018-12-19 – 2018-12-20 (×4): 1000 mg via ORAL
  Filled 2018-12-19 (×4): qty 2

## 2018-12-19 MED ORDER — WATER FOR IRRIGATION, STERILE IR SOLN
Status: DC | PRN
  Administered 2018-12-19 (×2): 1000 mL

## 2018-12-19 MED ORDER — ALUM & MAG HYDROXIDE-SIMETH 200-200-20 MG/5ML PO SUSP: 30.0000 mL | ORAL | Status: DC | PRN

## 2018-12-19 MED ORDER — ONDANSETRON HCL 4 MG/2ML IJ SOLN
INTRAMUSCULAR | Status: DC | PRN
  Administered 2018-12-19: 4 mg via INTRAVENOUS

## 2018-12-19 MED ORDER — BUPIVACAINE IN DEXTROSE 0.75-8.25 % IT SOLN
INTRATHECAL | Status: DC | PRN
  Administered 2018-12-19: 1.5 mL via INTRATHECAL

## 2018-12-19 MED ORDER — THYROID 120 MG PO TABS
120.0000 mg | ORAL_TABLET | Freq: Every day | ORAL | Status: DC
  Administered 2018-12-20: 120 mg via ORAL
  Filled 2018-12-19: qty 1

## 2018-12-19 MED ORDER — DIPHENHYDRAMINE HCL 12.5 MG/5ML PO ELIX: 12.5000 mg | ORAL_SOLUTION | ORAL | Status: DC | PRN

## 2018-12-19 MED ORDER — PHENOL 1.4 % MT LIQD: 1.0000 | OROMUCOSAL | Status: DC | PRN

## 2018-12-19 MED ORDER — ALPRAZOLAM 0.5 MG PO TABS
0.5000 mg | ORAL_TABLET | Freq: Three times a day (TID) | ORAL | Status: DC | PRN
  Administered 2018-12-20: 0.5 mg via ORAL
  Filled 2018-12-19: qty 1

## 2018-12-19 MED ORDER — DEXAMETHASONE SODIUM PHOSPHATE 10 MG/ML IJ SOLN
8.0000 mg | Freq: Once | INTRAMUSCULAR | Status: AC
  Administered 2018-12-19: 10 mg via INTRAVENOUS

## 2018-12-19 MED ORDER — HYDROMORPHONE HCL 1 MG/ML IJ SOLN
0.5000 mg | INTRAMUSCULAR | Status: DC | PRN
  Administered 2018-12-19: 17:00:00 0.5 mg via INTRAVENOUS
  Filled 2018-12-19: qty 1

## 2018-12-19 MED ORDER — ZIPRASIDONE HCL 40 MG PO CAPS
40.0000 mg | ORAL_CAPSULE | Freq: Every day | ORAL | Status: DC
  Administered 2018-12-19: 16:00:00 40 mg via ORAL
  Filled 2018-12-19 (×2): qty 1

## 2018-12-19 MED ORDER — METOCLOPRAMIDE HCL 5 MG/ML IJ SOLN: 5.0000 mg | Freq: Three times a day (TID) | INTRAMUSCULAR | Status: DC | PRN

## 2018-12-19 MED ORDER — SODIUM CHLORIDE (PF) 0.9 % IJ SOLN
INTRAMUSCULAR | Status: DC | PRN
  Administered 2018-12-19: 50 mL

## 2018-12-19 MED ORDER — DULOXETINE HCL 60 MG PO CPEP
60.0000 mg | ORAL_CAPSULE | Freq: Every day | ORAL | Status: DC
  Administered 2018-12-20: 60 mg via ORAL
  Filled 2018-12-19: qty 1

## 2018-12-19 MED ORDER — 0.9 % SODIUM CHLORIDE (POUR BTL) OPTIME
TOPICAL | Status: DC | PRN
  Administered 2018-12-19: 1000 mL

## 2018-12-19 MED ORDER — PROPOFOL 10 MG/ML IV BOLUS
INTRAVENOUS | Status: DC | PRN
  Administered 2018-12-19 (×2): 40 mg via INTRAVENOUS

## 2018-12-19 MED ORDER — MIDAZOLAM HCL 2 MG/2ML IJ SOLN
INTRAMUSCULAR | Status: AC
  Filled 2018-12-19: qty 2

## 2018-12-19 MED ORDER — DOCUSATE SODIUM 100 MG PO CAPS
100.0000 mg | ORAL_CAPSULE | Freq: Two times a day (BID) | ORAL | Status: DC
  Administered 2018-12-19 – 2018-12-20 (×2): 100 mg via ORAL
  Filled 2018-12-19 (×2): qty 1

## 2018-12-19 MED ORDER — SODIUM CHLORIDE 0.9 % IV BOLUS
500.0000 mL | Freq: Once | INTRAVENOUS | Status: AC
  Administered 2018-12-19: 500 mL via INTRAVENOUS

## 2018-12-19 MED ORDER — FENTANYL CITRATE (PF) 100 MCG/2ML IJ SOLN
50.0000 ug | Freq: Once | INTRAMUSCULAR | Status: AC
  Administered 2018-12-19: 09:00:00 100 ug via INTRAVENOUS

## 2018-12-19 MED ORDER — PROPOFOL 500 MG/50ML IV EMUL
INTRAVENOUS | Status: AC
  Filled 2018-12-19: qty 100

## 2018-12-19 MED ORDER — MENTHOL 3 MG MT LOZG: 1.0000 | LOZENGE | OROMUCOSAL | Status: DC | PRN

## 2018-12-19 MED ORDER — PHENYLEPHRINE 40 MCG/ML (10ML) SYRINGE FOR IV PUSH (FOR BLOOD PRESSURE SUPPORT)
PREFILLED_SYRINGE | INTRAVENOUS | Status: DC | PRN
  Administered 2018-12-19: 120 ug via INTRAVENOUS

## 2018-12-19 MED ORDER — PROMETHAZINE HCL 25 MG/ML IJ SOLN: 6.2500 mg | INTRAMUSCULAR | Status: DC | PRN

## 2018-12-19 MED ORDER — METOCLOPRAMIDE HCL 5 MG PO TABS: 5.0000 mg | ORAL_TABLET | Freq: Three times a day (TID) | ORAL | Status: DC | PRN

## 2018-12-19 MED ORDER — SODIUM CHLORIDE 0.9 % IR SOLN
Status: DC | PRN
  Administered 2018-12-19 (×2): 1000 mL

## 2018-12-19 MED ORDER — CEFAZOLIN SODIUM-DEXTROSE 2-4 GM/100ML-% IV SOLN
2.0000 g | INTRAVENOUS | Status: AC
  Administered 2018-12-19: 2 g via INTRAVENOUS
  Filled 2018-12-19: qty 100

## 2018-12-19 MED ORDER — ASPIRIN EC 325 MG PO TBEC
325.0000 mg | DELAYED_RELEASE_TABLET | Freq: Every day | ORAL | Status: DC
  Administered 2018-12-20: 325 mg via ORAL
  Filled 2018-12-19: qty 1

## 2018-12-19 MED ORDER — POVIDONE-IODINE 7.5 % EX SOLN
Freq: Once | CUTANEOUS | Status: AC
  Administered 2018-12-19: 08:00:00 via TOPICAL

## 2018-12-19 MED ORDER — POTASSIUM CHLORIDE IN NACL 20-0.9 MEQ/L-% IV SOLN
INTRAVENOUS | Status: DC
  Administered 2018-12-19 (×2): via INTRAVENOUS
  Filled 2018-12-19 (×3): qty 1000

## 2018-12-19 MED ORDER — ACETAMINOPHEN 10 MG/ML IV SOLN: 1000.0000 mg | Freq: Once | INTRAVENOUS | Status: DC | PRN

## 2018-12-19 MED ORDER — DEXAMETHASONE SODIUM PHOSPHATE 10 MG/ML IJ SOLN
10.0000 mg | Freq: Three times a day (TID) | INTRAMUSCULAR | Status: DC
  Administered 2018-12-19 – 2018-12-20 (×3): 10 mg via INTRAVENOUS
  Filled 2018-12-19 (×3): qty 1

## 2018-12-19 SURGICAL SUPPLY — 69 items
APL PRP STRL LF DISP 70% ISPRP (MISCELLANEOUS) ×2
ATTUNE MED DOME PAT 32 KNEE (Knees) ×1 IMPLANT
ATTUNE MED DOME PAT 32MM KNEE (Knees) ×1 IMPLANT
ATTUNE PSFEM LTSZ5 NARCEM KNEE (Femur) ×2 IMPLANT
ATTUNE PSRP INSR SZ5 6 KNEE (Insert) ×1 IMPLANT
ATTUNE PSRP INSR SZ5 6MM KNEE (Insert) ×1 IMPLANT
BAG SPEC THK2 15X12 ZIP CLS (MISCELLANEOUS) ×1
BAG ZIPLOCK 12X15 (MISCELLANEOUS) ×3 IMPLANT
BASEPLATE TIBIAL ROTATING SZ 4 (Knees) ×2 IMPLANT
BLADE HEX COATED 2.75 (ELECTRODE) ×3 IMPLANT
BLADE SAGITTAL 25.0X1.19X90 (BLADE) ×2 IMPLANT
BLADE SAGITTAL 25.0X1.19X90MM (BLADE) ×1
BLADE SAW SGTL 13X75X1.27 (BLADE) ×3 IMPLANT
BLADE SURG SZ10 CARB STEEL (BLADE) ×4 IMPLANT
BNDG CMPR MED 10X6 ELC LF (GAUZE/BANDAGES/DRESSINGS) ×1
BNDG ELASTIC 6X10 VLCR STRL LF (GAUZE/BANDAGES/DRESSINGS) ×3 IMPLANT
BOWL SMART MIX CTS (DISPOSABLE) ×3 IMPLANT
BSPLAT TIB 4 CMNT ROT PLAT STR (Knees) ×1 IMPLANT
CEMENT HV SMART SET (Cement) ×6 IMPLANT
CHLORAPREP W/TINT 26 (MISCELLANEOUS) ×6 IMPLANT
CLOSURE WOUND 1/2 X4 (GAUZE/BANDAGES/DRESSINGS) ×1
COVER SURGICAL LIGHT HANDLE (MISCELLANEOUS) ×3 IMPLANT
COVER WAND RF STERILE (DRAPES) IMPLANT
CUFF TOURN SGL QUICK 34 (TOURNIQUET CUFF) ×3
CUFF TRNQT CYL 34X4.125X (TOURNIQUET CUFF) ×1 IMPLANT
DECANTER SPIKE VIAL GLASS SM (MISCELLANEOUS) ×6 IMPLANT
DRAPE ORTHO SPLIT 77X108 STRL (DRAPES) ×3
DRAPE SHEET LG 3/4 BI-LAMINATE (DRAPES) ×3 IMPLANT
DRAPE SURG ORHT 6 SPLT 77X108 (DRAPES) ×1 IMPLANT
DRAPE U-SHAPE 47X51 STRL (DRAPES) ×3 IMPLANT
DRSG AQUACEL AG ADV 3.5X10 (GAUZE/BANDAGES/DRESSINGS) ×3 IMPLANT
ELECT REM PT RETURN 15FT ADLT (MISCELLANEOUS) ×3 IMPLANT
GLOVE BIO SURGEON STRL SZ7 (GLOVE) ×3 IMPLANT
GLOVE BIOGEL PI IND STRL 7.0 (GLOVE) ×1 IMPLANT
GLOVE BIOGEL PI IND STRL 7.5 (GLOVE) ×1 IMPLANT
GLOVE BIOGEL PI INDICATOR 7.0 (GLOVE) ×2
GLOVE BIOGEL PI INDICATOR 7.5 (GLOVE) ×2
GLOVE SS BIOGEL STRL SZ 7.5 (GLOVE) ×1 IMPLANT
GLOVE SUPERSENSE BIOGEL SZ 7.5 (GLOVE) ×2
GOWN STRL REUS W/ TWL LRG LVL3 (GOWN DISPOSABLE) ×2 IMPLANT
GOWN STRL REUS W/TWL LRG LVL3 (GOWN DISPOSABLE) ×6
HANDPIECE INTERPULSE COAX TIP (DISPOSABLE) ×3
HOLDER FOLEY CATH W/STRAP (MISCELLANEOUS) IMPLANT
HOOD PEEL AWAY FLYTE STAYCOOL (MISCELLANEOUS) ×9 IMPLANT
IMMOBILIZER KNEE 20 (SOFTGOODS) ×3
IMMOBILIZER KNEE 20 THIGH 36 (SOFTGOODS) IMPLANT
KIT TURNOVER KIT A (KITS) ×3 IMPLANT
MANIFOLD NEPTUNE II (INSTRUMENTS) ×3 IMPLANT
MARKER SKIN DUAL TIP RULER LAB (MISCELLANEOUS) ×3 IMPLANT
NEEDLE HYPO 22GX1.5 SAFETY (NEEDLE) ×3 IMPLANT
NS IRRIG 1000ML POUR BTL (IV SOLUTION) ×3 IMPLANT
PACK TOTAL KNEE CUSTOM (KITS) ×3 IMPLANT
PENCIL SMOKE EVACUATOR (MISCELLANEOUS) IMPLANT
PIN STEINMAN FIXATION KNEE (PIN) ×2 IMPLANT
PIN THREADED HEADED SIGMA (PIN) ×2 IMPLANT
PROTECTOR NERVE ULNAR (MISCELLANEOUS) ×3 IMPLANT
SET HNDPC FAN SPRY TIP SCT (DISPOSABLE) ×1 IMPLANT
STAPLER VISISTAT 35W (STAPLE) IMPLANT
STRIP CLOSURE SKIN 1/2X4 (GAUZE/BANDAGES/DRESSINGS) ×2 IMPLANT
SUT MNCRL AB 3-0 PS2 18 (SUTURE) ×3 IMPLANT
SUT VIC AB 0 CT1 36 (SUTURE) ×6 IMPLANT
SUT VIC AB 1 CT1 36 (SUTURE) ×3 IMPLANT
SUT VIC AB 2-0 CT1 27 (SUTURE) ×6
SUT VIC AB 2-0 CT1 TAPERPNT 27 (SUTURE) ×2 IMPLANT
SYR CONTROL 10ML LL (SYRINGE) ×6 IMPLANT
TRAY FOLEY MTR SLVR 14FR STAT (SET/KITS/TRAYS/PACK) ×3 IMPLANT
WATER STERILE IRR 1000ML POUR (IV SOLUTION) ×6 IMPLANT
YANKAUER SUCT BULB TIP 10FT TU (MISCELLANEOUS) ×3 IMPLANT
YANKAUER SUCT BULB TIP NO VENT (SUCTIONS) ×3 IMPLANT

## 2018-12-19 NOTE — Plan of Care (Signed)
Plan of care 

## 2018-12-19 NOTE — Transfer of Care (Signed)
Immediate Anesthesia Transfer of Care Note  Patient: Loretta Dawson  Procedure(s) Performed: LEFT TOTAL KNEE ARTHROPLASTY (Left Knee)  Patient Location: PACU  Anesthesia Type:Spinal  Level of Consciousness: awake, alert  and oriented  Airway & Oxygen Therapy: Patient Spontanous Breathing and Patient connected to face mask oxygen  Post-op Assessment: Report given to RN and Post -op Vital signs reviewed and stable  Post vital signs: Reviewed and stable  Last Vitals:  Vitals Value Taken Time  BP 129/85 12/19/18 1157  Temp    Pulse 72 12/19/18 1158  Resp 18 12/19/18 1158  SpO2 100 % 12/19/18 1158  Vitals shown include unvalidated device data.  Last Pain:  Vitals:   12/19/18 0754  TempSrc: Oral         Complications: No apparent anesthesia complications

## 2018-12-19 NOTE — Progress Notes (Signed)
AssistedDr. Houser with left, ultrasound guided, adductor canal block. Side rails up, monitors on throughout procedure. See vital signs in flow sheet. Tolerated Procedure well.  

## 2018-12-19 NOTE — Anesthesia Postprocedure Evaluation (Signed)
Anesthesia Post Note  Patient: Loretta Dawson  Procedure(s) Performed: LEFT TOTAL KNEE ARTHROPLASTY (Left Knee)     Patient location during evaluation: Nursing Unit Anesthesia Type: Spinal Level of consciousness: oriented and awake and alert Pain management: pain level controlled Vital Signs Assessment: post-procedure vital signs reviewed and stable Respiratory status: spontaneous breathing and respiratory function stable Cardiovascular status: blood pressure returned to baseline and stable Postop Assessment: no headache, no backache, no apparent nausea or vomiting and patient able to bend at knees Anesthetic complications: no    Last Vitals:  Vitals:   12/19/18 0935 12/19/18 1157  BP:    Pulse: 87   Resp: (!) 21   Temp:  (!) 36.4 C  SpO2: 99%     Last Pain:  Vitals:   12/19/18 1157  TempSrc:   PainSc: 0-No pain                 Barnet Glasgow

## 2018-12-19 NOTE — Evaluation (Signed)
Physical Therapy Evaluation Patient Details Name: Loretta Dawson MRN: [id number] DOB: 1966-01-12 Today's Date: 12/19/2018   History of Present Illness  Patient is 53 y.o. female s/p Lt TKA on 12/19/18 with PMH significant for vertigo, HLD, Depression, Anxiety, OA.  Clinical Impression  Loretta Dawson is a 53 y.o. female POD 0 s/p Lt TKA. Patient reports independence with mobility at baseline. Patient is now limited by functional impairments (see PT problem list below) and requires min assist for transfers and gait with RW. Patient was able to ambulate ~45 feet with RW and min assist with cues for walker management. Patient instructed in exercise to facilitate ROM and circulation. Patient will benefit from continued skilled PT interventions to address impairments and progress towards PLOF. Acute PT will follow to progress mobility and stair training in preparation for safe discharge home.    Follow Up Recommendations Follow surgeon's recommendation for DC plan and follow-up therapies    Equipment Recommendations  None recommended by PT    Recommendations for Other Services       Precautions / Restrictions Precautions Precautions: Fall Restrictions Weight Bearing Restrictions: No LLE Weight Bearing: Weight bearing as tolerated      Mobility  Bed Mobility Overal bed mobility: Needs Assistance Bed Mobility: Supine to Sit     Supine to sit: HOB elevated;Min assist     General bed mobility comments: cues for sequencing and assist for LE mobility, pt using bed rails  Transfers Overall transfer level: Needs assistance Equipment used: Rolling walker (2 wheeled) Transfers: Sit to/from Stand Sit to Stand: Min assist         General transfer comment: cues for hand placement and technique with RW, assist to initiate power up and steady with rising  Ambulation/Gait Ambulation/Gait assistance: Min assist Gait Distance (Feet): 45 Feet Assistive device: Rolling walker (2  wheeled) Gait Pattern/deviations: Step-through pattern;Decreased stride length;Decreased stance time - left;Decreased step length - right Gait velocity: decreased   General Gait Details: cues for safe step sequencing within RW and to maintain safe proximity with RW, intermittent assist required fro walker management.  Stairs            Wheelchair Mobility    Modified Rankin (Stroke Patients Only)       Balance Overall balance assessment: Needs assistance Sitting-balance support: Feet supported;No upper extremity supported Sitting balance-Leahy Scale: Good     Standing balance support: During functional activity;Bilateral upper extremity supported Standing balance-Leahy Scale: Poor                Pertinent Vitals/Pain Pain Assessment: 0-10 Pain Score: 8  Pain Location: Lt knee Pain Descriptors / Indicators: Aching;Sore Pain Intervention(s): Limited activity within patient's tolerance;Monitored during session;Repositioned;Ice applied    Home Living Family/patient expects to be discharged to:: Private residence Living Arrangements: Spouse/significant other;Children Available Help at Discharge: Family;Available 24 hours/day Type of Home: House Home Access: Stairs to enter Entrance Stairs-Rails: Right Entrance Stairs-Number of Steps: 5 (4 with rail up to landing, then turn and step up threshold at door) Home Layout: Two level;Able to live on main level with bedroom/bathroom Home Equipment: Bedside commode;Cane - single point;Walker - 2 wheels      Prior Function Level of Independence: Independent               Hand Dominance   Dominant Hand: Right    Extremity/Trunk Assessment   Upper Extremity Assessment Upper Extremity Assessment: Overall WFL for tasks assessed    Lower Extremity Assessment Lower Extremity  Assessment: Overall WFL for tasks assessed;LLE deficits/detail LLE Deficits / Details: pt limited by pain and unable to complete SLR,  immobilzer donned LLE: Unable to fully assess due to immobilization LLE Sensation: WNL LLE Coordination: WNL    Cervical / Trunk Assessment Cervical / Trunk Assessment: Normal  Communication   Communication: No difficulties  Cognition Arousal/Alertness: Awake/alert Behavior During Therapy: WFL for tasks assessed/performed Overall Cognitive Status: Within Functional Limits for tasks assessed             General Comments      Exercises Total Joint Exercises Ankle Circles/Pumps: AROM;Seated;Both;15 reps   Assessment/Plan    PT Assessment Patient needs continued PT services  PT Problem List Decreased strength;Decreased balance;Decreased range of motion;Decreased mobility;Decreased activity tolerance;Decreased knowledge of use of DME       PT Treatment Interventions DME instruction;Functional mobility training;Balance training;Patient/family education;Modalities;Gait training;Therapeutic activities;Therapeutic exercise;Stair training    PT Goals (Current goals can be found in the Care Plan section)  Acute Rehab PT Goals Patient Stated Goal: get back to independence PT Goal Formulation: With patient Time For Goal Achievement: 12/26/18 Potential to Achieve Goals: Good    Frequency 7X/week    AM-PAC PT "6 Clicks" Mobility  Outcome Measure Help needed turning from your back to your side while in a flat bed without using bedrails?: A Little Help needed moving from lying on your back to sitting on the side of a flat bed without using bedrails?: A Little Help needed moving to and from a bed to a chair (including a wheelchair)?: A Little Help needed standing up from a chair using your arms (e.g., wheelchair or bedside chair)?: A Little Help needed to walk in hospital room?: A Little Help needed climbing 3-5 steps with a railing? : A Little 6 Click Score: 18    End of Session Equipment Utilized During Treatment: Gait belt;Left knee immobilizer Activity Tolerance: Patient  tolerated treatment well Patient left: with call bell/phone within reach;in chair;with family/visitor present;with chair alarm set Nurse Communication: Mobility status PT Visit Diagnosis: Muscle weakness (generalized) (M62.81);Difficulty in walking, not elsewhere classified (R26.2)    Time: [id number] PT Time Calculation (min) (ACUTE ONLY): 37 min   Charges:   PT Evaluation $PT Eval Low Complexity: 1 Low PT Treatments $Gait Training: 8-22 mins        Kipp Brood, PT, DPT Physical Therapist with [facility]  12/19/2018 8:00 PM

## 2018-12-19 NOTE — Anesthesia Procedure Notes (Signed)
Anesthesia Regional Block: Adductor canal block   Pre-Anesthetic Checklist: ,, timeout performed, Correct Patient, Correct Site, Correct Laterality, Correct Procedure, Correct Position, site marked, Risks and benefits discussed,  Surgical consent,  Pre-op evaluation,  At surgeon's request and post-op pain management  Laterality: Lower and Left  Prep: chloraprep       Needles:  Injection technique: Single-shot  Needle Type: Echogenic Needle     Needle Length: 9cm  Needle Gauge: 22     Additional Needles:   Procedures:,,,, ultrasound used (permanent image in chart),,,,  Narrative:  Start time: 12/19/2018 8:49 AM End time: 12/19/2018 8:55 AM Injection made incrementally with aspirations every 5 mL.  Performed by: Personally  Anesthesiologist: Barnet Glasgow, MD  Additional Notes: Block assessed prior to surgery. Pt tolerated procedure well.

## 2018-12-19 NOTE — Interval H&P Note (Signed)
History and Physical Interval Note:  12/19/2018 8:54 AM  Loretta Dawson  has presented today for surgery, with the diagnosis of Wilder.  The various methods of treatment have been discussed with the patient and family. After consideration of risks, benefits and other options for treatment, the patient has consented to  Procedure(s): TOTAL KNEE ARTHROPLASTY (Left) as a surgical intervention.  The patient's history has been reviewed, patient examined, no change in status, stable for surgery.  I have reviewed the patient's chart and labs.  Questions were answered to the patient's satisfaction.     Lorn Junes

## 2018-12-19 NOTE — Op Note (Signed)
MRN:     [id number] DOB/AGE:    [birth year] / 53 y.o.       OPERATIVE REPORT   DATE OF PROCEDURE:  12/19/2018      PREOPERATIVE DIAGNOSIS:   Primary Localized Osteoarthritis left Knee       Estimated body mass index is 29.28 kg/m as calculated from the following:   Height as of 12/12/18: 5\' 3"  (1.6 m).   Weight as of 12/12/18: 75 kg.                                                       POSTOPERATIVE DIAGNOSIS:   Same                                                                 PROCEDURE:  Procedure(s): LEFT TOTAL KNEE ARTHROPLASTY Using Depuy Attune RP implants #5 narrow Femur, #4Tibia, 72mm  RP bearing, 32 Patella    SURGEON: Lorenzo Arscott A. Noemi Chapel, MD   ASSISTANT: Matthew Saras, PA-C, present and scrubbed throughout the case, critical for retraction, instrumentation, and closure.  ANESTHESIA: Spinal with Adductor Nerve Block  TOURNIQUET TIME: 55 minutes   COMPLICATIONS:  None       SPECIMENS: None   INDICATIONS FOR PROCEDURE: The patient has djd of the knee with varus deformities, XR shows bone on bone arthritis. Patient has failed all conservative measures including anti-inflammatory medicines, narcotics, attempts at exercise and weight loss, cortisone injections and viscosupplementation.  Risks and benefits of surgery have been discussed, questions answered.    DESCRIPTION OF PROCEDURE: The patient identified by armband, received right adductor canal block and IV antibiotics, in the holding area at [facility]. Patient taken to the operating room, appropriate anesthetic monitors were attached. Spinal anesthesia induced with the patient in supine position, Foley catheter was inserted. Tourniquet applied high to the operative thigh. Lateral post and foot positioner applied to the table, the lower extremity was then prepped and draped in usual sterile fashion from the ankle to the tourniquet. Time-out procedure was performed. The limb was wrapped with an Esmarch bandage and  the tourniquet inflated to 365 mmHg.   We began the operation by making a 6cm anterior midline incision. Small bleeders in the skin and the subcutaneous tissue identified and cauterized. Transverse retinaculum was incised and reflected medially and a medial parapatellar arthrotomy was accomplished. the patella was everted and theprepatellar fat pad resected. The superficial medial collateral ligament was then elevated from anterior to posterior along the proximal flare of the tibia and anterior half of the menisci resected. The knee was hyperflexed exposing bone on bone arthritis. Peripheral and notch osteophytes as well as the cruciate ligaments were then resected. We continued to work our way around posteriorly along the proximal tibia, and externally rotated the tibia subluxing it out from underneath the femur. A McHale retractor was placed through the notch and a lateral Hohmann retractor placed, and an external tibial guide was placed.  The tibial cutting guide was pinned into place allowing resection of 4 mm of bone medially and about 6 mm of bone laterally because of her  varus deformity.   Satisfied with the tibial resection, we then entered the distal femur 2 mm anterior to the PCL origin with the intramedullary guide rod and applied the distal femoral cutting guide set at 51mm, with 5 degrees of valgus. This was pinned along the epicondylar axis. At this point, the distal femoral cut was accomplished without difficulty. We then sized for a 5 narrow femoral component and pinned the guide in 3 degrees of external rotation.The chamfer cutting guide was pinned into place. The anterior, posterior, and chamfer cuts were accomplished without difficulty followed by the  RP box cutting guide and the box cut. We also removed posterior osteophytes from the posterior femoral condyles. At this time, the knee was brought into full extension. We checked our extension and flexion gaps and found them symmetric at  6.  The patella thickness measured at 41m m. We set the cutting guide at 15 and removed the posterior patella sized for 32 button and drilled the lollipop. The knee was then once again hyperflexed exposing the proximal tibia. We sized for a # 4 tibial base plate, applied the smokestack and the conical reamer followed by the the Delta fin keel punch. We then hammered into place the  RP trial femoral component, inserted a trial bearing, trial patellar button, and took the knee through range of motion from 0-130 degrees. No thumb pressure was required for patellar tracking.   At this point, all trial components were removed, a double batch of DePuy HV cement with Zinacef 1.5gms  was mixed and applied to all bony metallic mating surfaces. In order, we hammered into place the tibial tray and removed excess cement, the femoral component and removed excess cement, a 6 mm  RP bearing was inserted, and the knee brought to full extension with compression. The patellar button was clamped into place, and excess cement removed. While the cement cured the wound was irrigated out with normal saline solution pulse lavage, and exparel was injected throughout the knee. Ligament stability and patellar tracking were checked and found to be excellent..   The parapatellar arthrotomy was closed with  #1 Vicryl suture. The subcutaneous tissue with 0 and 2-0 undyed Vicryl suture, and 4-0 Monocryl.. A dressing of Aquaseal, 4 x 4, dressing sponges, Webril, and Ace wrap applied. Needle and sponge count were correct times 2.The patient awakened, extubated, and taken to recovery room without difficulty. Vascular status was normal, pulses 2+ and symmetric.    Lorn Junes 04/12/2017, 8:56 AM

## 2018-12-19 NOTE — Anesthesia Procedure Notes (Signed)
Spinal  Patient location during procedure: OR Start time: 12/19/2018 9:58 AM Staffing Resident/CRNA: [place] Quaid Yeakle C, CRNA Performed: resident/CRNA  Preanesthetic Checklist Completed: patient identified, site marked, surgical consent, pre-op evaluation, timeout performed, IV checked, risks and benefits discussed and monitors and equipment checked Spinal Block Patient position: sitting Prep: site prepped and draped and DuraPrep Patient monitoring: heart rate, cardiac monitor, continuous pulse ox and blood pressure Approach: midline Location: L3-4 Injection technique: single-shot Needle Needle type: Pencan  Needle gauge: 24 G Needle length: 9 cm Assessment Sensory level: T4 Additional Notes IV functioning, monitors applied to pt. Expiration date of kit checked and confirmed to be in date. Sterile prep and drape, hand hygiene and sterile gloved used. Pt was positioned and spine was prepped in sterile fashion. Skin was anesthetized with lidocaine. Free flow of clear CSF obtained prior to injecting local anesthetic into CSF x 1 attempt. Spinal needle aspirated freely following injection. Needle was carefully withdrawn, and pt tolerated procedure well. Loss of motor and sensory on exam post injection.

## 2018-12-20 ENCOUNTER — Encounter (HOSPITAL_COMMUNITY): Payer: Self-pay | Admitting: Orthopedic Surgery

## 2018-12-20 LAB — CBC
HCT: 36.7 % (ref 36.0–46.0)
Hemoglobin: 12.1 g/dL (ref 12.0–15.0)
MCH: 33 pg (ref 26.0–34.0)
MCHC: 33 g/dL (ref 30.0–36.0)
MCV: 100 fL (ref 80.0–100.0)
Platelets: 248 10*3/uL (ref 150–400)
RBC: 3.67 MIL/uL — ABNORMAL LOW (ref 3.87–5.11)
RDW: 12.1 % (ref 11.5–15.5)
WBC: 12.7 10*3/uL — ABNORMAL HIGH (ref 4.0–10.5)
nRBC: 0 % (ref 0.0–0.2)

## 2018-12-20 LAB — URINE CULTURE: Culture: NO GROWTH

## 2018-12-20 LAB — BASIC METABOLIC PANEL
Anion gap: 8 (ref 5–15)
BUN: 6 mg/dL (ref 6–20)
CO2: 21 mmol/L — ABNORMAL LOW (ref 22–32)
Calcium: 8.8 mg/dL — ABNORMAL LOW (ref 8.9–10.3)
Chloride: 110 mmol/L (ref 98–111)
Creatinine, Ser: 0.48 mg/dL (ref 0.44–1.00)
GFR calc Af Amer: >60 mL/min (ref >60)
GFR calc non Af Amer: >60 mL/min (ref >60)
Glucose, Bld: 140 mg/dL — ABNORMAL HIGH (ref 70–99)
Potassium: 4 mmol/L (ref 3.5–5.1)
Sodium: 139 mmol/L (ref 135–145)

## 2018-12-20 MED ORDER — DOCUSATE SODIUM 100 MG PO CAPS: ORAL_CAPSULE | ORAL | 0 refills | Status: DC

## 2018-12-20 MED ORDER — ASPIRIN 325 MG PO TBEC: 325.0000 mg | DELAYED_RELEASE_TABLET | Freq: Every day | ORAL | 0 refills | Status: DC

## 2018-12-20 MED ORDER — OXYCODONE HCL 5 MG PO TABS: ORAL_TABLET | ORAL | 0 refills | Status: DC

## 2018-12-20 MED ORDER — POLYETHYLENE GLYCOL 3350 17 G PO PACK: PACK | ORAL | 0 refills | Status: DC

## 2018-12-20 MED ORDER — SODIUM CHLORIDE 0.9 % IV BOLUS
500.0000 mL | Freq: Once | INTRAVENOUS | Status: AC
  Administered 2018-12-20: 500 mL via INTRAVENOUS

## 2018-12-20 NOTE — Discharge Summary (Signed)
Patient ID: Loretta Dawson MRN: [id number] DOB/AGE: [birth year] 51 y.o.  Admit date: 12/19/2018 Discharge date: 12/20/2018  Admission Diagnoses:  Active Problems:   Primary localized osteoarthritis of left knee   Discharge Diagnoses:  Same  Past Medical History:  Diagnosis Date  . Anemia   . Anxiety   . Arthritis   . Breast mass 06/12/2016   rt breast lump  . Candida infection   . Chronic constipation   . Colon polyp   . Depression   . History of kidney stones   . Hyperlipidemia    patient denies  . Hypotension   . Hypothyroidism   . Irritable bowel syndrome    extreme  . Migraine   . Thyroid disease   . UTI (lower urinary tract infection)   . Vasovagal response   . Vasovagal response    after taking IUD out  . Vertigo     Surgeries: Procedure(s): LEFT TOTAL KNEE ARTHROPLASTY on 12/19/2018   Consultants:   Discharged Condition: Improved  Hospital Course: VERNESSA GAILES is an 53 y.o. female who was admitted 12/19/2018 for operative treatment of<principal problem not specified>. Patient has severe unremitting pain that affects sleep, daily activities, and work/hobbies. After pre-op clearance the patient was taken to the operating room on 12/19/2018 and underwent  Procedure(s): LEFT TOTAL KNEE ARTHROPLASTY.    Patient was given perioperative antibiotics:  Anti-infectives (From admission, onward)   Start     Dose/Rate Route Frequency Ordered Stop   12/19/18 1600  ceFAZolin (ANCEF) IVPB 2g/100 mL premix     2 g 200 mL/hr over 30 Minutes Intravenous Every 6 hours 12/19/18 1311 12/19/18 2237   12/19/18 0745  ceFAZolin (ANCEF) IVPB 2g/100 mL premix     2 g 200 mL/hr over 30 Minutes Intravenous On call to O.R. 12/19/18 [id number] 12/19/18 0956       Patient was given sequential compression devices, early ambulation, and chemoprophylaxis to prevent DVT.  Patient benefited maximally from hospital stay and there were no complications.    Recent vital signs:  Patient  Vitals for the past 24 hrs:  BP Temp Temp src Pulse Resp SpO2 Height Weight  12/20/18 1005 120/79 97.9 F (36.6 C) Oral 82 16 98 % - -  12/20/18 0610 107/77 98.1 F (36.7 C) Oral 78 18 97 % - -  12/20/18 0109 100/68 97.7 F (36.5 C) Oral 77 18 98 % - -  12/19/18 2248 93/67 (!) 97.5 F (36.4 C) Oral 76 16 97 % - -  12/19/18 1816 123/90 97.6 F (36.4 C) - 87 18 95 % - -  12/19/18 1607 126/84 97.7 F (36.5 C) - 77 16 99 % - -  12/19/18 1505 122/81 98 F (36.7 C) - 82 17 99 % - -  12/19/18 1405 (!) 130/92 (!) 97.5 F (36.4 C) - 73 16 100 % - -  12/19/18 1302 109/76 98.2 F (36.8 C) Oral 72 16 100 % 5\' 3"  (1.6 m) 75 kg  12/19/18 1245 124/76 (!) 97.5 F (36.4 C) - 69 15 100 % - -  12/19/18 1230 124/76 - - 70 19 100 % - -     Recent laboratory studies:  Recent Labs    12/20/18 0242  WBC 12.7*  HGB 12.1  HCT 36.7  PLT 248  NA 139  K 4.0  CL 110  CO2 21*  BUN 6  CREATININE 0.48  GLUCOSE 140*  CALCIUM 8.8*     Discharge Medications:  Allergies as of 12/20/2018      Reactions   Sulfa Antibiotics Rash   Other Other (See Comments)   Sutures caused an infection   Ciprofloxacin Rash      Medication List    STOP taking these medications   CHROMIUM-CINNAMON PO   Co Q-10 [id number] MG Caps   Garlic 123XX123 MG Caps   TURMERIC CURCUMIN PO   VITAMIN D-VITAMIN K PO     TAKE these medications   ALPRAZolam 0.5 MG tablet Commonly known as: XANAX Take 0.5 mg by mouth 3 (three) times daily as needed for anxiety.   aspirin 325 MG EC tablet Take 1 tablet (325 mg total) by mouth daily with breakfast. Start taking on: December 21, 2018   cefUROXime 500 MG tablet Commonly known as: CEFTIN Take 500 mg by mouth 2 (two) times daily with a meal.   docusate sodium 100 MG capsule Commonly known as: COLACE 1 tab 2 times a day while on narcotics.  STOOL SOFTENER   DULoxetine 30 MG capsule Commonly known as: CYMBALTA Take 60 mg by mouth daily.   estazolam 2 MG tablet Commonly  known as: PROSOM Take 1-2 mg by mouth at bedtime.   Fish Oil 1200 MG Caps Take 1,200 mg by mouth at bedtime.   Flonase Allergy Relief 50 MCG/ACT nasal spray Generic drug: fluticasone Place 2 sprays into both nostrils daily as needed for allergies.   gabapentin 300 MG capsule Commonly known as: NEURONTIN Take 300 mg by mouth at bedtime.   lamoTRIgine 200 MG tablet Commonly known as: LAMICTAL Take 200 mg by mouth at bedtime.   Magnesium Gluconate 250 MG Tabs Take 250 mg by mouth daily.   methylphenidate 36 MG CR tablet Commonly known as: CONCERTA Take 36 mg by mouth daily as needed (focus).   multivitamin capsule Take 1 capsule by mouth daily.   oxyCODONE 5 MG immediate release tablet Commonly known as: Oxy IR/ROXICODONE 1 po q 4 hrs prn pain   polyethylene glycol 17 g packet Commonly known as: MIRALAX / GLYCOLAX 17grams in 6 oz of something to drink twice a day until bowel movement.  LAXITIVE.  Restart if two days since last bowel movement   Restora Caps Take 1 capsule by mouth daily.   rosuvastatin 5 MG tablet Commonly known as: CRESTOR Take 5 mg by mouth daily.   thyroid 120 MG tablet Commonly known as: ARMOUR Take 120 mg by mouth daily before breakfast.   valACYclovir 500 MG tablet Commonly known as: VALTREX Take 500 mg by mouth daily as needed.   vitamin C 1000 MG tablet Take 1,000 mg by mouth daily.   zinc gluconate 50 MG tablet Take 50 mg by mouth daily.   ziprasidone 40 MG capsule Commonly known as: GEODON Take 40 mg by mouth daily.   zonisamide 100 MG capsule Commonly known as: ZONEGRAN Take 200 mg by mouth daily.   ZyrTEC Allergy 10 MG tablet Generic drug: cetirizine Take 10 mg by mouth daily.            Discharge Care Instructions  (From admission, onward)         Start     Ordered   12/20/18 0000  Change dressing    Comments: DO NOT REMOVE BANDAGE OVER SURGICAL INCISION.  [place] WHOLE LEG INCLUDING OVER THE WATERPROOF BANDAGE  WITH SOAP AND WATER EVERY DAY.   12/20/18 1217          Diagnostic Studies: No results found.  Disposition:  Discharge disposition: 01-Home or Self Care       Discharge Instructions    CPM   Complete by: As directed    Continuous passive motion machine (CPM):      Use the CPM from 0 to 90 for 6 hours per day.       You may break it up into 2 or 3 sessions per day.      Use CPM for 2 weeks or until you are told to stop.   Call MD / Call 911   Complete by: As directed    If you experience chest pain or shortness of breath, CALL 911 and be transported to the hospital emergency room.  If you develope a fever above 101 F, pus (white drainage) or increased drainage or redness at the wound, or calf pain, call your surgeon's office.   Change dressing   Complete by: As directed    DO NOT REMOVE BANDAGE OVER SURGICAL INCISION.  [place] WHOLE LEG INCLUDING OVER THE WATERPROOF BANDAGE WITH SOAP AND WATER EVERY DAY.   Constipation Prevention   Complete by: As directed    Drink plenty of fluids.  Prune juice may be helpful.  You may use a stool softener, such as Colace (over the counter) 100 mg twice a day.  Use MiraLax (over the counter) for constipation as needed.   Diet - low sodium heart healthy   Complete by: As directed    Discharge instructions   Complete by: As directed    INSTRUCTIONS AFTER JOINT REPLACEMENT   Remove items at home which could result in a fall. This includes throw rugs or furniture in walking pathways ICE to the affected joint every three hours while awake for 30 minutes at a time, for at least the first 3-5 days, and then as needed for pain and swelling.  Continue to use ice for pain and swelling. You may notice swelling that will progress down to the foot and ankle.  This is normal after surgery.  Elevate your leg when you are not up walking on it.   Continue to use the breathing machine you got in the hospital (incentive spirometer) which will help keep your  temperature down.  It is common for your temperature to cycle up and down following surgery, especially at night when you are not up moving around and exerting yourself.  The breathing machine keeps your lungs expanded and your temperature down.   DIET:  As you were doing prior to hospitalization, we recommend a well-balanced diet.  DRESSING / WOUND CARE / SHOWERING  Keep the surgical dressing until follow up.  The dressing is water proof, so you can shower without any extra covering.  IF THE DRESSING FALLS OFF or the wound gets wet inside, change the dressing with sterile gauze.  Please use good hand washing techniques before changing the dressing.  Do not use any lotions or creams on the incision until instructed by your surgeon.    ACTIVITY  Increase activity slowly as tolerated, but follow the weight bearing instructions below.   No driving for 6 weeks or until further direction given by your physician.  You cannot drive while taking narcotics.  No lifting or carrying greater than 10 lbs. until further directed by your surgeon. Avoid periods of inactivity such as sitting longer than an hour when not asleep. This helps prevent blood clots.  You may return to work once you are authorized by your doctor.     WEIGHT BEARING  Weight bearing as tolerated with assist device (walker, cane, etc) as directed, use it as long as suggested by your surgeon or therapist, typically at least 2-3 weeks.   EXERCISES  Results after joint replacement surgery are often greatly improved when you follow the exercise, range of motion and muscle strengthening exercises prescribed by your doctor. Safety measures are also important to protect the joint from further injury. Any time any of these exercises cause you to have increased pain or swelling, decrease what you are doing until you are comfortable again and then slowly increase them. If you have problems or questions, call your caregiver or physical  therapist for advice.   Rehabilitation is important following a joint replacement. After just a few days of immobilization, the muscles of the leg can become weakened and shrink (atrophy).  These exercises are designed to build up the tone and strength of the thigh and leg muscles and to improve motion. Often times heat used for twenty to thirty minutes before working out will loosen up your tissues and help with improving the range of motion but do not use heat for the first two weeks following surgery (sometimes heat can increase post-operative swelling).   These exercises can be done on a training (exercise) mat, on the floor, on a table or on a bed. Use whatever works the best and is most comfortable for you.    Use music or television while you are exercising so that the exercises are a pleasant break in your day. This will make your life better with the exercises acting as a break in your routine that you can look forward to.   Perform all exercises about fifteen times, three times per day or as directed.  You should exercise both the operative leg and the other leg as well.   Exercises include:  Quad Sets - Tighten up the muscle on the front of the thigh (Quad) and hold for 5-10 seconds.   Straight Leg Raises - With your knee straight (if you were given a brace, keep it on), lift the leg to 60 degrees, hold for 3 seconds, and slowly lower the leg.  Perform this exercise against resistance later as your leg gets stronger.  Leg Slides: Lying on your back, slowly slide your foot toward your buttocks, bending your knee up off the floor (only go as far as is comfortable). Then slowly slide your foot back down until your leg is flat on the floor again.  Angel Wings: Lying on your back spread your legs to the side as far apart as you can without causing discomfort.  Hamstring Strength:  Lying on your back, push your heel against the floor with your leg straight by tightening up the muscles of your  buttocks.  Repeat, but this time bend your knee to a comfortable angle, and push your heel against the floor.  You may put a pillow under the heel to make it more comfortable if necessary.   A rehabilitation program following joint replacement surgery can speed recovery and prevent re-injury in the future due to weakened muscles. Contact your doctor or a physical therapist for more information on knee rehabilitation.    CONSTIPATION  Constipation is defined medically as fewer than three stools per week and severe constipation as less than one stool per week.  Even if you have a regular bowel pattern at home, your normal regimen is likely to be disrupted due to multiple reasons following surgery.  Combination of anesthesia, postoperative  narcotics, change in appetite and fluid intake all can affect your bowels.   YOU MUST use at least one of the following options; they are listed in order of increasing strength to get the job done.  They are all available over the counter, and you may need to use some, POSSIBLY even all of these options:    Drink plenty of fluids (prune juice may be helpful) and high fiber foods Colace 100 mg by mouth twice a day  Senokot for constipation as directed and as needed Dulcolax (bisacodyl), take with full glass of water  Miralax (polyethylene glycol) once or twice a day as needed.  If you have tried all these things and are unable to have a bowel movement in the first 3-4 days after surgery call either your surgeon or your primary doctor.    If you experience loose stools or diarrhea, hold the medications until you stool forms back up.  If your symptoms do not get better within 1 week or if they get worse, check with your doctor.  If you experience "the worst abdominal pain ever" or develop nausea or vomiting, please contact the office immediately for further recommendations for treatment.   ITCHING:  If you experience itching with your medications, try taking only a  single pain pill, or even half a pain pill at a time.  You can also use Benadryl over the counter for itching or also to help with sleep.   TED HOSE STOCKINGS:  Use stockings on both legs until for at least 2 weeks or as directed by physician office. They may be removed at night for sleeping.  MEDICATIONS:  See your medication summary on the "After Visit Summary" that nursing will review with you.  You may have some home medications which will be placed on hold until you complete the course of blood thinner medication.  It is important for you to complete the blood thinner medication as prescribed.  PRECAUTIONS:  If you experience chest pain or shortness of breath - call 911 immediately for transfer to the hospital emergency department.   If you develop a fever greater that 101 F, purulent drainage from wound, increased redness or drainage from wound, foul odor from the wound/dressing, or calf pain - CONTACT YOUR SURGEON.                                                   FOLLOW-UP APPOINTMENTS:  If you do not already have a post-op appointment, please call the office for an appointment to be seen by your surgeon.  Guidelines for how soon to be seen are listed in your "After Visit Summary", but are typically between 1-4 weeks after surgery.  OTHER INSTRUCTIONS:   Knee Replacement:  Do not place pillow under knee, focus on keeping the knee straight while resting. CPM instructions: 0-90 degrees, 2 hours in the morning, 2 hours in the afternoon, and 2 hours in the evening. Place foam block, curve side up under heel at all times except when in CPM or when walking.  DO NOT modify, tear, cut, or change the foam block in any way.  MAKE SURE YOU:  Understand these instructions.  Get help right away if you are not doing well or get worse.    Thank you for letting us be a part of your medical care team.  It is a privilege we respect greatly.  We hope these instructions will help you stay on track for a  fast and full recovery!   Do not put a pillow under the knee. Place it under the heel.   Complete by: As directed    Place gray foam block, curve side up under heel at all times except when in CPM or when walking.  DO NOT modify, tear, cut, or change in any way the gray foam block.   Increase activity slowly as tolerated   Complete by: As directed    Patient may shower   Complete by: As directed    Aquacel dressing is water proof    Wash over it and the whole leg with soap and water at the end of your shower   TED hose   Complete by: As directed    Use stockings (TED hose) for 2 weeks on both leg(s).  You may remove them at night for sleeping.      Follow-up Information    [facility]. Call.   Contact information: [facility] [place] [address], [place] [address] [phone]       Elsie Saas, MD Follow up on 01/02/2019.   Specialty: Orthopedic Surgery Why: appointment time 4 pm Contact information: [address] [place] [address] [phone]            Signed: Linda Hedges 12/20/2018, 12:23 PM

## 2018-12-20 NOTE — TOC Transition Note (Signed)
Transition of Care [facility]) - CM/SW Discharge Note   Patient Details  Name: Loretta Dawson MRN: [id number] Date of Birth: 08-05-1965  [facility]) CM/SW Contact:  Lia Hopping, [place] Phone Number: 12/20/2018, 11:14 AM   Clinical Narrative:    CSW met with the patient at bedside to discuss Home Health options. CSW gave the patient a list of Medicare Home Health agencies. Patient and spouse discuss options and chose [facility]. Patient spouse is employed by [employer] and familiar with the therapy services. CSW confirm with Rep. Sharmon Revere.   Therapy Plan: [place]  Has DME (preop)   Final next level of care: Home w Home Health Services Barriers to Discharge: No Barriers Identified   Patient Goals and CMS Choice Patient states their goals for this hospitalization and ongoing recovery are:: Continue therapy at home with support of spouse CMS Medicare.gov Compare Post Acute Care list provided to:: Patient Choice offered to / list presented to : Patient, Spouse  Discharge Placement  Home                      Discharge Plan and Services                          HH Arranged: PT [facility] Agency: [place] Date [place]: 12/20/18 Time [place]: 1112 Representative spoke with at [place]: [place] ([place]) Interventions     Readmission Risk Interventions No flowsheet data found.

## 2018-12-20 NOTE — Progress Notes (Signed)
Physical Therapy Treatment Patient Details Name: Loretta Dawson MRN: [id number] DOB: 1965/06/10 Today's Date: 12/20/2018    History of Present Illness Patient is 53 y.o. female s/p Lt TKA on 12/19/18 with PMH significant for vertigo, HLD, Depression, Anxiety, OA.    PT Comments    Pt educated on safe stair technique and then ambulated around unit.  Pt also reviewed exercises in sitting.  Pt reports f/u with HHPT.  Pt ready for d/c home today.    Follow Up Recommendations  Follow surgeon's recommendation for DC plan and follow-up therapies     Equipment Recommendations  None recommended by PT    Recommendations for Other Services       Precautions / Restrictions Precautions Precautions: Fall;Knee Restrictions LLE Weight Bearing: Weight bearing as tolerated    Mobility  Bed Mobility Overal bed mobility: Needs Assistance Bed Mobility: Supine to Sit     Supine to sit: Supervision        Transfers Overall transfer level: Needs assistance Equipment used: Rolling walker (2 wheeled) Transfers: Sit to/from Stand Sit to Stand: Min guard         General transfer comment: cues for hand placement and technique with RW  Ambulation/Gait Ambulation/Gait assistance: Min guard Gait Distance (Feet): 400 Feet Assistive device: Rolling walker (2 wheeled) Gait Pattern/deviations: Step-through pattern;Decreased stride length;Decreased stance time - left;Antalgic     General Gait Details: verbal cues for knee flexion and shorter steps if too painful, pt wished to ambulate entire unit   Stairs Stairs: Yes Stairs assistance: Min guard Stair Management: One rail Right;Step to pattern;Forwards Number of Stairs: 2 General stair comments: verbal cues for safety and sequence; performed x2   Wheelchair Mobility    Modified Rankin (Stroke Patients Only)       Balance                                            Cognition Arousal/Alertness:  Awake/alert Behavior During Therapy: WFL for tasks assessed/performed Overall Cognitive Status: Within Functional Limits for tasks assessed                                        Exercises Total Joint Exercises Ankle Circles/Pumps: AROM;Both;Seated Quad Sets: AROM;Left;Seated Short Arc Quad: AROM;Seated;Left Heel Slides: AAROM;Left;Seated Hip ABduction/ADduction: AROM;Left;Seated;10 reps Straight Leg Raises: AROM;Left;10 reps    General Comments        Pertinent Vitals/Pain Pain Assessment: 0-10 Pain Score: 4  Pain Location: Lt knee Pain Descriptors / Indicators: Aching;Sore Pain Intervention(s): Repositioned;Monitored during session;Premedicated before session    Home Living                      Prior Function            PT Goals (current goals can now be found in the care plan section) Progress towards PT goals: Progressing toward goals    Frequency    7X/week      PT Plan Current plan remains appropriate    Co-evaluation              AM-PAC PT "6 Clicks" Mobility   Outcome Measure  Help needed turning from your back to your side while in a flat bed without using bedrails?: A Little Help needed moving  from lying on your back to sitting on the side of a flat bed without using bedrails?: A Little Help needed moving to and from a bed to a chair (including a wheelchair)?: A Little Help needed standing up from a chair using your arms (e.g., wheelchair or bedside chair)?: A Little Help needed to walk in hospital room?: A Little Help needed climbing 3-5 steps with a railing? : A Little 6 Click Score: 18    End of Session Equipment Utilized During Treatment: Gait belt Activity Tolerance: Patient tolerated treatment well Patient left: in bed;with call bell/phone within reach;with family/visitor present Nurse Communication: Mobility status PT Visit Diagnosis: Muscle weakness (generalized) (M62.81);Difficulty in walking, not  elsewhere classified (R26.2)     Time: [id number] PT Time Calculation (min) (ACUTE ONLY): 18 min  Charges:  $Gait Training: 8-22 mins                     Carmelia Bake, PT, DPT Acute Rehabilitation Services Office: [phone] Pager: [phone]  Trena Platt 12/20/2018, 1:47 PM

## 2019-09-07 DIAGNOSIS — E039 Hypothyroidism, unspecified: Secondary | ICD-10-CM | POA: Diagnosis not present

## 2019-09-07 DIAGNOSIS — E785 Hyperlipidemia, unspecified: Secondary | ICD-10-CM | POA: Diagnosis not present

## 2019-09-22 DIAGNOSIS — F332 Major depressive disorder, recurrent severe without psychotic features: Secondary | ICD-10-CM | POA: Diagnosis not present

## 2019-09-22 DIAGNOSIS — F4323 Adjustment disorder with mixed anxiety and depressed mood: Secondary | ICD-10-CM | POA: Diagnosis not present

## 2019-09-22 DIAGNOSIS — F1021 Alcohol dependence, in remission: Secondary | ICD-10-CM | POA: Diagnosis not present

## 2019-10-02 DIAGNOSIS — Z1231 Encounter for screening mammogram for malignant neoplasm of breast: Secondary | ICD-10-CM | POA: Diagnosis not present

## 2019-10-13 DIAGNOSIS — F4323 Adjustment disorder with mixed anxiety and depressed mood: Secondary | ICD-10-CM | POA: Diagnosis not present

## 2019-10-13 DIAGNOSIS — F1021 Alcohol dependence, in remission: Secondary | ICD-10-CM | POA: Diagnosis not present

## 2019-10-13 DIAGNOSIS — F332 Major depressive disorder, recurrent severe without psychotic features: Secondary | ICD-10-CM | POA: Diagnosis not present

## 2019-11-21 DIAGNOSIS — R140 Abdominal distension (gaseous): Secondary | ICD-10-CM | POA: Diagnosis not present

## 2019-11-21 DIAGNOSIS — K5903 Drug induced constipation: Secondary | ICD-10-CM | POA: Diagnosis not present

## 2019-11-21 DIAGNOSIS — Z86010 Personal history of colonic polyps: Secondary | ICD-10-CM | POA: Diagnosis not present

## 2019-11-21 DIAGNOSIS — F1010 Alcohol abuse, uncomplicated: Secondary | ICD-10-CM | POA: Diagnosis not present

## 2019-12-12 ENCOUNTER — Ambulatory Visit: Payer: Commercial Managed Care - PPO | Admitting: Dermatology

## 2020-01-31 ENCOUNTER — Encounter: Payer: Self-pay | Admitting: Dermatology

## 2020-01-31 ENCOUNTER — Ambulatory Visit (INDEPENDENT_AMBULATORY_CARE_PROVIDER_SITE_OTHER): Payer: Commercial Managed Care - PPO | Admitting: Dermatology

## 2020-01-31 ENCOUNTER — Other Ambulatory Visit: Payer: Self-pay

## 2020-01-31 DIAGNOSIS — Z1283 Encounter for screening for malignant neoplasm of skin: Secondary | ICD-10-CM | POA: Diagnosis not present

## 2020-01-31 DIAGNOSIS — I781 Nevus, non-neoplastic: Secondary | ICD-10-CM | POA: Diagnosis not present

## 2020-01-31 DIAGNOSIS — D225 Melanocytic nevi of trunk: Secondary | ICD-10-CM

## 2020-02-03 ENCOUNTER — Encounter: Payer: Self-pay | Admitting: Dermatology

## 2020-02-03 NOTE — Progress Notes (Signed)
   Follow-Up Visit   Subjective  Loretta Dawson is a 55 y.o. female who presents for the following: Annual Exam (NO CONCERNS).  General skin check Location:  Duration:  Quality:  Associated Signs/Symptoms: Modifying Factors:  Severity:  Timing: Context:   Objective  Well appearing patient in no apparent distress; mood and affect are within normal limits. Objective  Chest - Medial [facility]): General skin examination, no atypical moles, melanoma, non-mole skin cancer  Objective  Left Thigh - Anterior: 1 cm mixed angioma. No dermoscopic atypia.  Objective  Right Breast: Tan-brown symmetric macules and papules. 1 cm seborrheic keratosis on right breast.   A full examination was performed including scalp, head, eyes, ears, nose, lips, neck, chest, axillae, abdomen, back, buttocks, bilateral upper extremities, bilateral lower extremities, hands, feet, fingers, toes, fingernails, and toenails. All findings within normal limits unless otherwise noted below.   Assessment & Plan    Screening exam for skin cancer Chest - Medial [facility])  Annual skin examination, encouraged to self examine with spouse twice annually  Spider angioma Left Thigh - Anterior  Leave if stable  Melanocytic nevi of trunk Right Breast     I, Lavonna Monarch, MD, have reviewed all documentation for this visit.  The documentation on 02/03/20 for the exam, diagnosis, procedures, and orders are all accurate and complete.

## 2020-02-15 ENCOUNTER — Other Ambulatory Visit: Payer: Commercial Managed Care - PPO

## 2020-02-15 DIAGNOSIS — Z20822 Contact with and (suspected) exposure to covid-19: Secondary | ICD-10-CM

## 2020-02-16 LAB — SARS-COV-2, NAA 2 DAY TAT

## 2020-02-16 LAB — NOVEL CORONAVIRUS, NAA: SARS-CoV-2, NAA: NOT DETECTED

## 2020-04-09 ENCOUNTER — Other Ambulatory Visit: Payer: Self-pay | Admitting: Gastroenterology

## 2020-04-09 DIAGNOSIS — R109 Unspecified abdominal pain: Secondary | ICD-10-CM

## 2020-04-09 DIAGNOSIS — R140 Abdominal distension (gaseous): Secondary | ICD-10-CM

## 2020-04-25 ENCOUNTER — Other Ambulatory Visit: Payer: Commercial Managed Care - PPO

## 2020-04-26 ENCOUNTER — Emergency Department (HOSPITAL_COMMUNITY): Payer: Commercial Managed Care - PPO

## 2020-04-26 ENCOUNTER — Encounter (HOSPITAL_COMMUNITY): Payer: Self-pay

## 2020-04-26 ENCOUNTER — Other Ambulatory Visit: Payer: Self-pay

## 2020-04-26 ENCOUNTER — Emergency Department (HOSPITAL_COMMUNITY)
Admission: EM | Admit: 2020-04-26 | Discharge: 2020-04-26 | Disposition: A | Discharge status: Home / Self Care | Payer: Commercial Managed Care - PPO | Attending: Emergency Medicine | Admitting: Emergency Medicine

## 2020-04-26 DIAGNOSIS — R7989 Other specified abnormal findings of blood chemistry: Secondary | ICD-10-CM

## 2020-04-26 DIAGNOSIS — Z7982 Long term (current) use of aspirin: Secondary | ICD-10-CM | POA: Diagnosis not present

## 2020-04-26 DIAGNOSIS — Z87891 Personal history of nicotine dependence: Secondary | ICD-10-CM | POA: Insufficient documentation

## 2020-04-26 DIAGNOSIS — R112 Nausea with vomiting, unspecified: Secondary | ICD-10-CM | POA: Diagnosis present

## 2020-04-26 DIAGNOSIS — E039 Hypothyroidism, unspecified: Secondary | ICD-10-CM | POA: Diagnosis not present

## 2020-04-26 DIAGNOSIS — R7401 Elevation of levels of liver transaminase levels: Secondary | ICD-10-CM | POA: Diagnosis not present

## 2020-04-26 DIAGNOSIS — Z96652 Presence of left artificial knee joint: Secondary | ICD-10-CM | POA: Insufficient documentation

## 2020-04-26 DIAGNOSIS — R748 Abnormal levels of other serum enzymes: Secondary | ICD-10-CM | POA: Diagnosis not present

## 2020-04-26 DIAGNOSIS — Z79899 Other long term (current) drug therapy: Secondary | ICD-10-CM | POA: Insufficient documentation

## 2020-04-26 DIAGNOSIS — R17 Unspecified jaundice: Secondary | ICD-10-CM

## 2020-04-26 DIAGNOSIS — R1084 Generalized abdominal pain: Secondary | ICD-10-CM | POA: Diagnosis not present

## 2020-04-26 HISTORY — DX: Alcohol abuse, uncomplicated: F10.10

## 2020-04-26 LAB — URINALYSIS, ROUTINE W REFLEX MICROSCOPIC
Bacteria, UA: NONE SEEN
Bilirubin Urine: NEGATIVE
Glucose, UA: NEGATIVE mg/dL
Ketones, ur: 5 mg/dL — AB
Nitrite: NEGATIVE
Protein, ur: 30 mg/dL — AB
Specific Gravity, Urine: 1.018 (ref 1.005–1.030)
pH: 6 (ref 5.0–8.0)

## 2020-04-26 LAB — CBC
HCT: 43.3 % (ref 36.0–46.0)
Hemoglobin: 15.5 g/dL — ABNORMAL HIGH (ref 12.0–15.0)
MCH: 33 pg (ref 26.0–34.0)
MCHC: 35.8 g/dL (ref 30.0–36.0)
MCV: 92.3 fL (ref 80.0–100.0)
Platelets: 259 10*3/uL (ref 150–400)
RBC: 4.69 MIL/uL (ref 3.87–5.11)
RDW: 11.7 % (ref 11.5–15.5)
WBC: 14.4 10*3/uL — ABNORMAL HIGH (ref 4.0–10.5)
nRBC: 0 % (ref 0.0–0.2)

## 2020-04-26 LAB — COMPREHENSIVE METABOLIC PANEL
ALT: 40 U/L (ref 0–44)
AST: 56 U/L — ABNORMAL HIGH (ref 15–41)
Albumin: 4.9 g/dL (ref 3.5–5.0)
Alkaline Phosphatase: 85 U/L (ref 38–126)
Anion gap: 14 (ref 5–15)
BUN: 20 mg/dL (ref 6–20)
CO2: 24 mmol/L (ref 22–32)
Calcium: 9.8 mg/dL (ref 8.9–10.3)
Chloride: 91 mmol/L — ABNORMAL LOW (ref 98–111)
Creatinine, Ser: 0.65 mg/dL (ref 0.44–1.00)
GFR, Estimated: >60 mL/min (ref >60)
Glucose, Bld: 136 mg/dL — ABNORMAL HIGH (ref 70–99)
Potassium: 4 mmol/L (ref 3.5–5.1)
Sodium: 129 mmol/L — ABNORMAL LOW (ref 135–145)
Total Bilirubin: 2.6 mg/dL — ABNORMAL HIGH (ref 0.3–1.2)
Total Protein: 8.2 g/dL — ABNORMAL HIGH (ref 6.5–8.1)

## 2020-04-26 LAB — LIPASE, BLOOD: Lipase: 127 U/L — ABNORMAL HIGH (ref 11–51)

## 2020-04-26 MED ORDER — IOHEXOL 300 MG/ML  SOLN
100.0000 mL | Freq: Once | INTRAMUSCULAR | Status: AC | PRN
  Administered 2020-04-26: 100 mL via INTRAVENOUS

## 2020-04-26 MED ORDER — SODIUM CHLORIDE 0.9 % IV BOLUS
1000.0000 mL | Freq: Once | INTRAVENOUS | Status: AC
  Administered 2020-04-26: 1000 mL via INTRAVENOUS

## 2020-04-26 MED ORDER — MORPHINE SULFATE (PF) 4 MG/ML IV SOLN
4.0000 mg | Freq: Once | INTRAVENOUS | Status: AC
  Administered 2020-04-26: 4 mg via INTRAVENOUS
  Filled 2020-04-26: qty 1

## 2020-04-26 MED ORDER — ONDANSETRON HCL 4 MG/2ML IJ SOLN
4.0000 mg | Freq: Once | INTRAMUSCULAR | Status: AC
  Administered 2020-04-26: 4 mg via INTRAVENOUS
  Filled 2020-04-26: qty 2

## 2020-04-26 MED ORDER — ONDANSETRON 4 MG PO TBDP: 4.0000 mg | ORAL_TABLET | Freq: Three times a day (TID) | ORAL | 0 refills | Status: DC | PRN

## 2020-04-26 MED ORDER — OXYCODONE-ACETAMINOPHEN 5-325 MG PO TABS: 1.0000 | ORAL_TABLET | Freq: Four times a day (QID) | ORAL | 0 refills | Status: DC | PRN

## 2020-04-26 NOTE — ED Notes (Signed)
Pt ambulated to bathroom with no assistance.  

## 2020-04-26 NOTE — Discharge Instructions (Addendum)
It was our pleasure taking care of you here in the emergency department  Your imaging did not show any acute findings.  Your labs did show an elevated lipase which could indicate the beginnings of pancreatitis.  This can be caused by alcohol.  I would recommend not drinking any more alcohol. He also has a mildly elevated bilirubin level.  You will need recheck of your labs within 1 week.  I would suggest a clear liquid diet over the next 24 hours.  If you are able to tolerate that you may gradually increase your diet to bland foods.  I have written you for additional pain medication as well as the nausea medicine.  Take as prescribed.  Follow-up with primary care provider in 1 week  Return to the emergency department if you have any new or worsening symptoms

## 2020-04-26 NOTE — ED Triage Notes (Signed)
Patient c/o abdominal pain, N/v/d since yesterday. Patient states she was prescribed zofran and phenergan,but no relief.

## 2020-04-26 NOTE — ED Provider Notes (Signed)
[place] DEPT Provider Note   CSN: [id number] Arrival date & time: 04/26/20  0854    History Chief Complaint  Patient presents with  . Abdominal Pain  . Emesis  . Diarrhea    Loretta Dawson is a 55 y.o. female with medical history of EtOH abuse in remission, kidney stones, depression, anxiety who presents for evaluation of emesis.  Began approximately 730 yesterday morning.  Was initially seen by [facility] medical.  Was given Zofran ADT and Phenergan suppositories.  She has been using these without any relief of her emesis.  States she has "projectile" emesis.  This is NBNB.  Has associated generalized abdominal pain.  Rates her pain a 6/10.  Located upper abdomen.  Does not radiate into her right upper quadrant, back.  She has been trying liquids, crackers at home has been unable to keep these down.  Had one episode of loose stool yesterday x1.  No melena or bright blood per rectum.  Her husband did have some diarrhea as well.  No suspicious food intake, travel or antibiotics.  She is followed by Dr. Collene Mares with GI.  Has history of IBS.  Was supposed to have a CT abdomen pelvis yesterday for bloating and generalized abdominal pain.  She was not able to make this due to her loose stool.  Does states she is in remission from EtOH abuse however did have a "relapse" on Sunday through Wednesday.  States she had a 12 pack of truly hearts ulcers with her significant other.  No fever, chills, chest pain, shortness of breath, dysuria, hematuria, flank pain, paresthesias, unilateral weakness, facial droop.  Does feel generally weak due to inability to keep down any fluids.  Called PCP and was told to come here to emergency department for IV fluids and antiemetics.  She denies any chronic anti-inflammatory use no history of pancreatitis.  Denies additional aggravating or alleviating factors.  History obtained from patient and past medical records.  No interpreter used  Prior  abd hysterectomy, appendectomy   HPI     Past Medical History:  Diagnosis Date  . Anemia   . Anxiety   . Arthritis   . Breast mass 06/12/2016   rt breast lump  . Candida infection   . Chronic constipation   . Colon polyp   . Depression   . ETOH abuse   . History of kidney stones   . Hyperlipidemia    patient denies  . Hypotension   . Hypothyroidism   . Irritable bowel syndrome    extreme  . Migraine   . Thyroid disease   . UTI (lower urinary tract infection)   . Vasovagal response   . Vasovagal response    after taking IUD out  . Vertigo     Patient Active Problem List   Diagnosis Date Noted  . Primary localized osteoarthritis of left knee 12/19/2018  . Dysmenorrhea 05/02/2015  . Headache(784.0) 05/31/2013    Past Surgical History:  Procedure Laterality Date  . ABDOMINAL HYSTERECTOMY    . APPENDECTOMY    . BREAST ENHANCEMENT SURGERY    . COLONOSCOPY    . CYSTECTOMY    . INCISIONAL BREAST BIOPSY     benign  . ROBOTIC ASSISTED TOTAL HYSTERECTOMY WITH BILATERAL SALPINGO OOPHERECTOMY Bilateral 05/02/2015   Procedure: ROBOTIC ASSISTED TOTAL HYSTERECTOMY WITH BILATERAL SALPINGO OOPHORECTOMY;  Surgeon: Brien Few, MD;  Location: [facility];  Service: Gynecology;  Laterality: Bilateral;  3 hrs. BETH TO RNFA RYAN POPE THE  ROBOT REP WILL BE HERE FOR THE VESSEL SEALER  . TOTAL KNEE ARTHROPLASTY Left 12/19/2018   Procedure: LEFT TOTAL KNEE ARTHROPLASTY;  Surgeon: Elsie Saas, MD;  Location: [facility];  Service: Orthopedics;  Laterality: Left;  . UPPER GI ENDOSCOPY    . WISDOM TOOTH EXTRACTION       OB History   No obstetric history on file.     Family History  Problem Relation Age of Onset  . Breast cancer Mother   . Hypertension Mother   . Thyroid disease Mother   . Cancer Father   . Diabetes Father   . Hyperlipidemia Father   . Thyroid disease Father   . Asthma Other   . COPD Other   . Alcoholism Other   . Heart disease Other   . Other Other         Lipid disorder    Social History   Tobacco Use  . Smoking status: Former Smoker    Types: Cigarettes  . Smokeless tobacco: Never Used  . Tobacco comment: quit 2018 , smoked off and on since 1990   Vaping Use  . Vaping Use: Never used  Substance Use Topics  . Alcohol use: Not Currently    Alcohol/week: 1.0 standard drink    Types: 1 Glasses of wine per week  . Drug use: No    Home Medications Prior to Admission medications   Medication Sig Start Date End Date Taking? Authorizing Provider  ondansetron (ZOFRAN ODT) 4 MG disintegrating tablet Take 1 tablet (4 mg total) by mouth every 8 (eight) hours as needed for nausea or vomiting. 04/26/20  Yes Micki Cassel A, PA-C  oxyCODONE-acetaminophen (PERCOCET/ROXICET) 5-325 MG tablet Take 1 tablet by mouth every 6 (six) hours as needed for severe pain. 04/26/20  Yes Jaquane Boughner A, PA-C  ALPRAZolam (XANAX) 0.5 MG tablet Take 0.5 mg by mouth 3 (three) times daily as needed for anxiety. Patient not taking: Reported on 01/31/2020    [provider]  Ascorbic Acid (VITAMIN C) 1000 MG tablet Take 1,000 mg by mouth daily.    [provider]  aspirin EC 325 MG EC tablet Take 1 tablet (325 mg total) by mouth daily with breakfast. Patient not taking: Reported on 01/31/2020 12/21/18   Shepperson, Kirstin, PA-C  cefUROXime (CEFTIN) 500 MG tablet Take 500 mg by mouth 2 (two) times daily with a meal. Patient not taking: Reported on 01/31/2020    [provider]  cetirizine (ZYRTEC) 10 MG tablet Take 10 mg by mouth daily.    [provider]  docusate sodium (COLACE) 100 MG capsule 1 tab 2 times a day while on narcotics.  STOOL SOFTENER 12/20/18   Shepperson, Kirstin, PA-C  DULoxetine (CYMBALTA) 30 MG capsule Take 60 mg by mouth daily.     [provider]  estazolam (PROSOM) 2 MG tablet Take 1-2 mg by mouth at bedtime. 04/23/15   [provider]  fluticasone (FLONASE) 50 MCG/ACT nasal spray Place 2 sprays  into both nostrils daily as needed for allergies.     [provider]  gabapentin (NEURONTIN) 300 MG capsule Take 300 mg by mouth at bedtime.    [provider]  lamoTRIgine (LAMICTAL) 200 MG tablet Take 200 mg by mouth at bedtime.     [provider]  Magnesium Gluconate 250 MG TABS Take 250 mg by mouth daily.     [provider]  methylphenidate 36 MG PO CR tablet Take 36 mg by mouth daily  as needed (focus).  11/22/18   [provider]  Multiple Vitamin (MULTIVITAMIN) capsule Take 1 capsule by mouth daily.    [provider]  Omega-3 Fatty Acids (FISH OIL) 1200 MG CAPS Take 1,200 mg by mouth at bedtime.     [provider]  oxyCODONE (OXY IR/ROXICODONE) 5 MG immediate release tablet 1 po q 4 hrs prn pain Patient not taking: Reported on 01/31/2020 12/20/18   Shepperson, Kirstin, PA-C  polyethylene glycol (MIRALAX / GLYCOLAX) 17 g packet 17grams in 6 oz of something to drink twice a day until bowel movement.  LAXITIVE.  Restart if two days since last bowel movement 12/20/18   Shepperson, Kirstin, PA-C  Probiotic Product (RESTORA) CAPS Take 1 capsule by mouth daily.     [provider]  rosuvastatin (CRESTOR) 5 MG tablet Take 5 mg by mouth daily.    [provider]  thyroid (ARMOUR) 120 MG tablet Take 120 mg by mouth daily before breakfast.    [provider]  valACYclovir (VALTREX) 500 MG tablet Take 500 mg by mouth daily as needed.     [provider]  zinc gluconate 50 MG tablet Take 50 mg by mouth daily.    [provider]  ziprasidone (GEODON) 40 MG capsule Take 40 mg by mouth daily.    [provider]  zonisamide (ZONEGRAN) 100 MG capsule Take 200 mg by mouth daily.    [provider]    Allergies    Sulfa antibiotics, Other, and Ciprofloxacin  Review of Systems   Review of Systems  Constitutional: Positive for appetite change and fatigue.  HENT: Negative.    Respiratory: Negative.   Cardiovascular: Negative.   Gastrointestinal: Positive for abdominal pain, diarrhea, nausea and vomiting. Negative for abdominal distention, anal bleeding, blood in stool, constipation and rectal pain.  Genitourinary: Negative.   Musculoskeletal: Negative.   Skin: Negative.   Neurological: Positive for weakness (Generalized). Negative for dizziness, tremors, syncope, facial asymmetry, speech difficulty, light-headedness, numbness and headaches.  All other systems reviewed and are negative.   Physical Exam Updated Vital Signs BP 116/87   Pulse 99   Temp 98.9 F (37.2 C) (Oral)   Resp 20   Ht 5\' 3"  (1.6 m)   Wt 62.6 kg   LMP  (LMP Unknown) Comment: continous bleeding for weeks off and on  SpO2 98%   BMI 24.45 kg/m   Physical Exam Vitals and nursing note reviewed.  Constitutional:      General: She is not in acute distress.    Appearance: She is well-developed. She is not ill-appearing, toxic-appearing or diaphoretic.  HENT:     Head: Normocephalic and atraumatic.     Mouth/Throat:     Mouth: Mucous membranes are moist.     Pharynx: Oropharynx is clear.  Eyes:     Pupils: Pupils are equal, round, and reactive to light.  Cardiovascular:     Rate and Rhythm: Normal rate.     Heart sounds: Normal heart sounds.  Pulmonary:     Effort: Pulmonary effort is normal. No respiratory distress.     Breath sounds: Normal breath sounds.     Comments: Speaks in full sentences without difficulty. Abdominal:     General: Bowel sounds are normal. There is no distension.     Palpations: Abdomen is soft.     Tenderness: There is generalized abdominal tenderness and tenderness in the right upper quadrant, epigastric area and left upper quadrant. There is no right CVA  tenderness, left CVA tenderness, guarding or rebound. Negative signs include Murphy's sign.     Hernia: No hernia is present.     Comments: Generalized tenderness however worse to epigastric region.   Negative Murphy sign.  No rebound or guarding  Musculoskeletal:        General: Normal range of motion.     Cervical back: Normal range of motion.  Skin:    General: Skin is warm and dry.     Capillary Refill: Capillary refill takes less than 2 seconds.     Comments: No edema, erythema or warmth.  No fluctuance or induration.  Neurological:     General: No focal deficit present.     Mental Status: She is alert and oriented to person, place, and time.     Comments: Cranial nerves 2-12 grossly intact     ED Results / Procedures / Treatments   Labs (all labs ordered are listed, but only abnormal results are displayed) Labs Reviewed  LIPASE, BLOOD - Abnormal; Notable for the following components:      Result Value   Lipase 127 (*)    All other components within normal limits  COMPREHENSIVE METABOLIC PANEL - Abnormal; Notable for the following components:   Sodium 129 (*)    Chloride 91 (*)    Glucose, Bld 136 (*)    Total Protein 8.2 (*)    AST 56 (*)    Total Bilirubin 2.6 (*)    All other components within normal limits  URINALYSIS, ROUTINE W REFLEX MICROSCOPIC - Abnormal; Notable for the following components:   Color, Urine STRAW (*)    Hgb urine dipstick MODERATE (*)    Ketones, ur 5 (*)    Protein, ur 30 (*)    Leukocytes,Ua MODERATE (*)    All other components within normal limits  CBC - Abnormal; Notable for the following components:   WBC 14.4 (*)    Hemoglobin 15.5 (*)    All other components within normal limits    EKG EKG Interpretation  Date/Time:  Friday April 26 2020 09:39:37 EDT Ventricular Rate:  93 PR Interval:  157 QRS Duration: 76 QT Interval:  385 QTC Calculation: 479 R Axis:   34 Text Interpretation: Sinus rhythm Paired ventricular premature complexes Probable left atrial enlargement Low voltage, precordial leads Confirmed by Sherwood Gambler [id number]) on 04/26/2020 10:49:22 AM   Radiology CT Abdomen Pelvis W Contrast  Result Date:  04/26/2020 CLINICAL DATA:  Epigastric pain EXAM: CT ABDOMEN AND PELVIS WITH CONTRAST TECHNIQUE: Multidetector CT imaging of the abdomen and pelvis was performed using the standard protocol following bolus administration of intravenous contrast. CONTRAST:  156mL OMNIPAQUE IOHEXOL 300 MG/ML  SOLN COMPARISON:  05/31/2010 FINDINGS: Lower chest: No acute abnormality. Hepatobiliary: No solid liver abnormality is seen. No gallstones, gallbladder wall thickening, or biliary dilatation. Pancreas: Unremarkable. No pancreatic ductal dilatation or surrounding inflammatory changes. Spleen: Normal in size without significant abnormality. Adrenals/Urinary Tract: Adrenal glands are unremarkable. Kidneys are normal, without renal calculi, solid lesion, or hydronephrosis. Bladder is unremarkable. Stomach/Bowel: Stomach is within normal limits. Appendix is not clearly visualized. No evidence of bowel wall thickening, distention, or inflammatory changes. Occasional sigmoid diverticula. Vascular/Lymphatic: No significant vascular findings are present. No enlarged abdominal or pelvic lymph nodes. Reproductive: Status post hysterectomy. Other: No abdominal wall hernia or abnormality. No abdominopelvic ascites. Musculoskeletal: No acute or significant osseous findings. IMPRESSION: 1. No acute CT findings of the abdomen or pelvis to explain epigastric pain. 2. Status post hysterectomy. 3.  Sigmoid diverticulosis. Electronically Signed   By: Eddie Candle M.D.   On: 04/26/2020 11:47   US Abdomen Limited RUQ (LIVER/GB)  Result Date: 04/26/2020 CLINICAL DATA:  Elevated LFTs. EXAM: ULTRASOUND ABDOMEN LIMITED RIGHT UPPER QUADRANT COMPARISON:  Abdomen/pelvis CT 04/26/2020 FINDINGS: Gallbladder: No gallstones or gallbladder wall thickening. No pericholecystic fluid. The sonographer reports no sonographic Murphy's sign. Common bile duct: Diameter: 3 mm Liver: No focal lesion identified. Within normal limits in parenchymal echogenicity. Portal vein  is patent on color Doppler imaging with normal direction of blood flow towards the liver. Other: None. IMPRESSION: Unremarkable right upper quadrant ultrasound. Electronically Signed   By: Misty Stanley M.D.   On: 04/26/2020 13:54    Procedures Procedures   Medications Ordered in ED Medications  sodium chloride 0.9 % bolus 1,000 mL (0 mLs Intravenous Stopped 04/26/20 1429)  morphine 4 MG/ML injection 4 mg (4 mg Intravenous Given 04/26/20 1009)  ondansetron (ZOFRAN) injection 4 mg (4 mg Intravenous Given 04/26/20 1009)  iohexol (OMNIPAQUE) 300 MG/ML solution 100 mL (100 mLs Intravenous Contrast Given 04/26/20 1107)  ondansetron (ZOFRAN) injection 4 mg (4 mg Intravenous Given 04/26/20 1426)    ED Course  I have reviewed the triage vital signs and the nursing notes.  Pertinent labs & imaging results that were available during my care of the patient were reviewed by me and considered in my medical decision making (see chart for details).  55 year old here for evaluation nausea, vomiting and diarrhea.  Symptoms began approximately 26 hours PTA.  She is afebrile, nonseptic, not ill-appearing.  Has some generalized abdominal tenderness however worse to her upper abdomen.  Negative Murphy sign.  Multiple episodes of NBNB emesis, one loose stool yesterday without any melena or bright blood per rectum.  Did recently relapse and EtOH use.  No history of chronic NSAID use, no history of pancreatitis.  Her heart and lungs are clear.  No urinary complaints.  No chest pain, shortness of breath to suggest atypical ACS, PE or dissection.  No cough, shortness of breath, recent Covid exposures.  Neurovascularly intact.  Plan on labs, imaging and reassess  Labs and imaging personally reviewed and interpreted:  CBC leukocytosis at 14.4, Hgb 15.5 likely hemoconcentration  CMP sodium 129, glucose 136, AST 56, T bili 2.6 Lipase 127 Urinalysis moderate leuks, dirty catch, no bacteria, no nitrites, no UTI symptoms. CT AP  without any significant abnormality, spacifically no gallbladder stones, dilation, pancreatic inflammation EKG without ischemic changes  Patient reassessed. Pain improved. No emesis here in ED thus far. Nausea improved. Discussed labs and imaging thus far. Will add on [place] RUQ given elevated LFT and bili.  Reassess.  Feels a little nauseous however no emesis.  Took additional dose of Zofran, p.o. challenge.  Ultrasound reassuring.    Patient reassessed.  Tolerating p.o. intake.  No further pain.  Feel patient has likely early pancreatitis.  Will DC home with diet changes, recommended cessation of alcohol, antiemetics.  She will need to follow-up with her PCP for her mildly elevated bilirubin.  Lipase possibly elevated due to EtOH use or does have known hypertriglyceridemia currently on medication.  Patient is nontoxic, nonseptic appearing, in no apparent distress.  Patient's pain and other symptoms adequately managed in emergency department.  Fluid bolus given.  Labs, imaging and vitals reviewed.  Patient does not meet the SIRS or Sepsis criteria.  On repeat exam patient does not have a surgical abdomin and there are no peritoneal signs.  No indication of appendicitis, bowel  obstruction, bowel perforation, cholecystitis, diverticulitis, PID or ectopic pregnancy.   The patient has been appropriately medically screened and/or stabilized in the ED. I have low suspicion for any other emergent medical condition which would require further screening, evaluation or treatment in the ED or require inpatient management.  Patient is hemodynamically stable and in no acute distress.  Patient able to ambulate in department prior to ED.  Evaluation does not show acute pathology that would require ongoing or additional emergent interventions while in the emergency department or further inpatient treatment.  I have discussed the diagnosis with the patient and answered all questions.  Pain is been managed while in the  emergency department and patient has no further complaints prior to discharge.  Patient is comfortable with plan discussed in room and is stable for discharge at this time.  I have discussed strict return precautions for returning to the emergency department.  Patient was encouraged to follow-up with PCP/specialist refer to at discharge.    MDM Rules/Calculators/A&P                           Final Clinical Impression(s) / ED Diagnoses Final diagnoses:  Elevated LFTs  Nausea vomiting and diarrhea  Elevated lipase  Total bilirubin, elevated    Rx / DC Orders ED Discharge Orders         Ordered    oxyCODONE-acetaminophen (PERCOCET/ROXICET) 5-325 MG tablet  Every 6 hours PRN        04/26/20 1514    ondansetron (ZOFRAN ODT) 4 MG disintegrating tablet  Every 8 hours PRN        04/26/20 1514           Jenel Gierke A, PA-C 04/26/20 1516    Sherwood Gambler, MD 04/26/20 1828

## 2020-04-26 NOTE — ED Notes (Signed)
Pt denies nausea vomiting post-PO challenge

## 2020-09-04 ENCOUNTER — Encounter: Payer: Self-pay | Admitting: Neurology

## 2020-10-03 ENCOUNTER — Encounter: Payer: Self-pay | Admitting: Neurology

## 2020-10-03 ENCOUNTER — Other Ambulatory Visit: Payer: Self-pay

## 2020-10-03 ENCOUNTER — Ambulatory Visit (INDEPENDENT_AMBULATORY_CARE_PROVIDER_SITE_OTHER): Payer: Commercial Managed Care - PPO | Admitting: Neurology

## 2020-10-03 ENCOUNTER — Other Ambulatory Visit (INDEPENDENT_AMBULATORY_CARE_PROVIDER_SITE_OTHER): Payer: Commercial Managed Care - PPO

## 2020-10-03 VITALS — BP 117/83 | HR 99 | Ht 63.0 in | Wt 137.0 lb

## 2020-10-03 DIAGNOSIS — G5731 Lesion of lateral popliteal nerve, right lower limb: Secondary | ICD-10-CM

## 2020-10-03 LAB — B12 AND FOLATE PANEL
Folate: >23.4 ng/mL (ref >5.9)
Vitamin B-12: 712 pg/mL (ref 211–911)

## 2020-10-03 LAB — C-REACTIVE PROTEIN: CRP: <1 mg/dL (ref 0.5–20.0)

## 2020-10-03 LAB — SEDIMENTATION RATE: Sed Rate: 8 mm/hr (ref 0–30)

## 2020-10-03 NOTE — Patient Instructions (Addendum)
Start physical therapy for right foot strengthening  Check labs  Avoid crossing your legs  Return to clinic in 3-4 months

## 2020-10-03 NOTE — Progress Notes (Signed)
[facility] Clinic Note - Initial Visit   Date: 10/03/20  ZALIA HAUTALA MRN: [id number] DOB: 1965-09-25   Dear Dr. Noemi Chapel:  Thank you for your kind referral of Loretta Dawson for consultation of right foot drop. Although her history is well known to you, please allow [place] to reiterate it for the purpose of our medical record. The patient was accompanied to the clinic by self.    History of Present Illness: Loretta Dawson is a 55 y.o. left-handed female with depression, hypertension, hyperlipidemia, migraines, and hypothyroidism presenting for evaluation of right foot drop.   She had COVID infection in July and about a week into her infection, she developed numbness over the top of her right foot and noticed that her right slipper would not stay on her foot because of weakness.  She denies similar symptoms on the left leg.  No back pain or falls. She was evaluated at [facility] for foot drop.  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 She is here for further evaluation.  She takes Lamictal for migraines which is prescribed by her psychiatrist. She takes gabapentin for cervical spondylosis and has previously received ESI by Dr. Ron Agee.  History of alcohol abuse from [place] - 2010.  She has been in recovery since 2010.  No history of diabetes.  Out-side paper records, electronic medical record, and images have been reviewed where available and summarized as:  NCS/EMG of the right leg 09/03/2020:  At the present time, the abnormal electrophysiological findings are consistnet with the possibility of a focal demyelinating and axonal lesion of the peroneal nerve, at or hear the fibular head in the right lower extremity.    Lab Results  Component Value Date   [id number] 355 04/25/2008    Past Medical History:  Diagnosis Date   Anemia    Anxiety    Arthritis    Breast mass 06/12/2016   rt  breast lump   Candida infection    Chronic constipation    Colon polyp    Depression    ETOH abuse    History of kidney stones    Hyperlipidemia    patient denies   Hypotension    Hypothyroidism    Irritable bowel syndrome    extreme   Migraine    Thyroid disease    UTI (lower urinary tract infection)    Vasovagal response    Vasovagal response    after taking IUD out   Vertigo     Past Surgical History:  Procedure Laterality Date   ABDOMINAL HYSTERECTOMY     APPENDECTOMY     BREAST ENHANCEMENT SURGERY     COLONOSCOPY     CYSTECTOMY     INCISIONAL BREAST BIOPSY     benign   ROBOTIC ASSISTED TOTAL HYSTERECTOMY WITH BILATERAL SALPINGO OOPHERECTOMY Bilateral 05/02/2015   Procedure: ROBOTIC ASSISTED TOTAL HYSTERECTOMY WITH BILATERAL SALPINGO OOPHORECTOMY;  Surgeon: Brien Few, MD;  Location: [facility];  Service: Gynecology;  Laterality: Bilateral;  3 hrs. BETH TO RNFA RYAN POPE THE ROBOT REP WILL BE HERE FOR THE VESSEL SEALER   TOTAL KNEE ARTHROPLASTY Left 12/19/2018   Procedure: LEFT TOTAL KNEE ARTHROPLASTY;  Surgeon: Elsie Saas, MD;  Location: [facility];  Service: Orthopedics;  Laterality: Left;   UPPER GI ENDOSCOPY     WISDOM TOOTH EXTRACTION       Medications:  Outpatient Encounter Medications as of 10/03/2020  Medication Sig   Ascorbic Acid (  VITAMIN C) 1000 MG tablet Take 1,000 mg by mouth daily.   cetirizine (ZYRTEC) 10 MG tablet Take 10 mg by mouth daily.   DULoxetine (CYMBALTA) 30 MG capsule Take 60 mg by mouth daily.    estazolam (PROSOM) 2 MG tablet Take 1-2 mg by mouth at bedtime.   fluticasone (FLONASE) 50 MCG/ACT nasal spray Place 2 sprays into both nostrils daily as needed for allergies.    gabapentin (NEURONTIN) 300 MG capsule Take 300 mg by mouth at bedtime.   lamoTRIgine (LAMICTAL) 200 MG tablet Take 200 mg by mouth at bedtime.    levothyroxine (SYNTHROID) 137 MCG tablet Take 137 mcg by mouth daily before breakfast.   Magnesium Gluconate 250 MG TABS  Take 250 mg by mouth daily.    methylphenidate 36 MG PO CR tablet Take 36 mg by mouth daily as needed (focus).    Multiple Vitamin (MULTIVITAMIN) capsule Take 1 capsule by mouth daily.   Omega-3 Fatty Acids (FISH OIL) 1200 MG CAPS Take 1,200 mg by mouth at bedtime.    polyethylene glycol (MIRALAX / GLYCOLAX) 17 g packet 17grams in 6 oz of something to drink twice a day until bowel movement.  LAXITIVE.  Restart if two days since last bowel movement   Probiotic Product (RESTORA) CAPS Take 1 capsule by mouth daily.    rosuvastatin (CRESTOR) 5 MG tablet Take 5 mg by mouth daily.   valACYclovir (VALTREX) 500 MG tablet Take 500 mg by mouth daily as needed.    zinc gluconate 50 MG tablet Take 50 mg by mouth daily.   ziprasidone (GEODON) 40 MG capsule Take 40 mg by mouth daily.   zonisamide (ZONEGRAN) 100 MG capsule Take 200 mg by mouth daily.   [DISCONTINUED] ALPRAZolam (XANAX) 0.5 MG tablet Take 0.5 mg by mouth 3 (three) times daily as needed for anxiety. (Patient not taking: Reported on 01/31/2020)   [DISCONTINUED] aspirin EC 325 MG EC tablet Take 1 tablet (325 mg total) by mouth daily with breakfast. (Patient not taking: Reported on 01/31/2020)   [DISCONTINUED] cefUROXime (CEFTIN) 500 MG tablet Take 500 mg by mouth 2 (two) times daily with a meal. (Patient not taking: Reported on 01/31/2020)   [DISCONTINUED] docusate sodium (COLACE) 100 MG capsule 1 tab 2 times a day while on narcotics.  STOOL SOFTENER   [DISCONTINUED] ondansetron (ZOFRAN ODT) 4 MG disintegrating tablet Take 1 tablet (4 mg total) by mouth every 8 (eight) hours as needed for nausea or vomiting.   [DISCONTINUED] oxyCODONE (OXY IR/ROXICODONE) 5 MG immediate release tablet 1 po q 4 hrs prn pain (Patient not taking: Reported on 01/31/2020)   [DISCONTINUED] oxyCODONE-acetaminophen (PERCOCET/ROXICET) 5-325 MG tablet Take 1 tablet by mouth every 6 (six) hours as needed for severe pain.   [DISCONTINUED] thyroid (ARMOUR) 120 MG tablet Take 120 mg by  mouth daily before breakfast.   No facility-administered encounter medications on file as of 10/03/2020.    Allergies:  Allergies  Allergen Reactions   Sulfa Antibiotics Rash   Other Other (See Comments)    Sutures caused an infection   Ciprofloxacin Rash    Family History: Family History  Problem Relation Age of Onset   Uterine cancer Mother        Cancer is in 55   Breast cancer Mother    Hypertension Mother    Thyroid disease Mother    Lung cancer Mother    Diabetes Father        Type 1   Cancer Father  Hyperlipidemia Father    Thyroid disease Father    Hypertension Sister    Thyroid disease Sister    Asthma Other    COPD Other    Alcoholism Other    Heart disease Other    Other Other        Lipid disorder    Social History: Social History   Tobacco Use   Smoking status: Former    Types: Cigarettes   Smokeless tobacco: Never   Tobacco comments:    quit 2018 , smoked off and on since 1990   Vaping Use   Vaping Use: Never used  Substance Use Topics   Alcohol use: Not Currently    Alcohol/week: 1.0 standard drink    Types: 1 Glasses of wine per week   Drug use: No   Social History   Social History Narrative   Patient is married to [place]).   Patient does not have any biological children, 1 step daughter.   Patient has a BA   Patient is left handed.   Patient drinks 3 cups of caffeine daily.          Vital Signs:  BP 117/83   Pulse 99   Ht 5' 3"  (1.6 m)   Wt 137 lb (62.1 kg)   LMP  (LMP Unknown) Comment: continous bleeding for weeks off and on  SpO2 97%   BMI 24.27 kg/m    Neurological Exam: MENTAL STATUS including orientation to time, place, person, recent and remote memory, attention span and concentration, language, and fund of knowledge is normal.  Speech is not dysarthric.  CRANIAL NERVES: II:  No visual field defects.     III-IV-VI: Pupils equal round and reactive to light.  Normal conjugate, extra-ocular eye movements in  all directions of gaze.  No nystagmus.  No ptosis.   V:  Normal facial sensation.    VII:  Normal facial symmetry and movements.   VIII:  Normal hearing and vestibular function.   IX-X:  Normal palatal movement.   XI:  Normal shoulder shrug and head rotation.   XII:  Normal tongue strength and range of motion, no deviation or fasciculation.  MOTOR:  No atrophy, fasciculations or abnormal movements.  No pronator drift.   Upper Extremity:  Right  Left  Deltoid  5/5   5/5   Biceps  5/5   5/5   Triceps  5/5   5/5   Infraspinatus 5/5  5/5  Medial pectoralis 5/5  5/5  Wrist extensors  5/5   5/5   Wrist flexors  5/5   5/5   Finger extensors  5/5   5/5   Finger flexors  5/5   5/5   Dorsal interossei  5/5   5/5   Abductor pollicis  5/5   5/5   Tone (Ashworth scale)  0  0   Lower Extremity:  Right  Left  Hip flexors  5/5   5/5   Hip extensors  5/5   5/5   Adductor 5/5  5/5  Abductor 5/5  5/5  Knee flexors  5/5   5/5   Knee extensors  5/5   5/5   Dorsiflexors  3/5   5/5   Plantarflexors  5/5   5/5   Inversion 5/5  5/5  Eversion 3/5  5/5  Toe extensors  3/5   5/5   Toe flexors  5/5   5/5   Tone (Ashworth scale)  0  0   MSRs:  Right  Left                  brachioradialis 2+  2+  biceps 2+  2+  triceps 2+  2+  patellar 2+  2+  ankle jerk 1+  1+  Hoffman no  no  plantar response down  Down    SENSORY:  Temperature is reduced over the superficial peroneal distribution on the right, mildly reduced vibration at the right great toe.  Pin prick intact throughout.Romberg's sign absent.   COORDINATION/GAIT: Normal finger-to- nose-finger.  Intact rapid alternating movements bilaterally.  Mild steppage gait on the right foot, stable, unassisted. Toe walking intact, Unable to heel walk on the right. Tandem gait intact.    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   There have been case reports of focal entrapment neuropathy such as peroneal neuropathy following COVID19.  Management is supportive.  PLAN/RECOMMENDATIONS:  Check vitamin B12, folate, ESR, CRP, SPEP with IFE  Start PT for right foot strengthening Avoid crossing the legs  Return to clinic in 3-4 months.   Thank you for allowing me to participate in patient's care.  If I can answer any additional questions, I would be pleased to do so.    Sincerely,    Melida Northington K. Posey Pronto, DO

## 2020-10-08 LAB — PROTEIN ELECTROPHORESIS, SERUM
Albumin ELP: 4.8 g/dL (ref 3.8–4.8)
Alpha 1: 0.3 g/dL (ref 0.2–0.3)
Alpha 2: 0.6 g/dL (ref 0.5–0.9)
Beta 2: 0.3 g/dL (ref 0.2–0.5)
Beta Globulin: 0.5 g/dL (ref 0.4–0.6)
Gamma Globulin: 0.5 g/dL — ABNORMAL LOW (ref 0.8–1.7)
Total Protein: 7 g/dL (ref 6.1–8.1)

## 2020-10-08 LAB — IMMUNOFIXATION ELECTROPHORESIS
IgG (Immunoglobin G), Serum: 581 mg/dL — ABNORMAL LOW (ref 600–1640)
IgM, Serum: 103 mg/dL (ref 50–300)
Immunofix Electr Int: NOT DETECTED
Immunoglobulin A: 237 mg/dL (ref 47–310)

## 2020-10-09 ENCOUNTER — Telehealth: Payer: Self-pay | Admitting: Neurology

## 2020-10-09 NOTE — Telephone Encounter (Signed)
Patient called back in returning a call.  

## 2020-10-09 NOTE — Telephone Encounter (Signed)
Please see result note 

## 2021-02-03 ENCOUNTER — Ambulatory Visit: Payer: Commercial Managed Care - PPO | Admitting: Neurology

## 2021-02-03 ENCOUNTER — Ambulatory Visit: Payer: Commercial Managed Care - PPO | Admitting: Dermatology

## 2021-02-20 DIAGNOSIS — Z90710 Acquired absence of both cervix and uterus: Secondary | ICD-10-CM | POA: Diagnosis not present

## 2021-02-21 DIAGNOSIS — N930 Postcoital and contact bleeding: Secondary | ICD-10-CM | POA: Diagnosis not present

## 2021-02-21 DIAGNOSIS — N952 Postmenopausal atrophic vaginitis: Secondary | ICD-10-CM | POA: Diagnosis not present

## 2021-02-25 DIAGNOSIS — E785 Hyperlipidemia, unspecified: Secondary | ICD-10-CM | POA: Diagnosis not present

## 2021-02-25 DIAGNOSIS — E039 Hypothyroidism, unspecified: Secondary | ICD-10-CM | POA: Diagnosis not present

## 2021-02-28 DIAGNOSIS — E871 Hypo-osmolality and hyponatremia: Secondary | ICD-10-CM | POA: Diagnosis not present

## 2021-02-28 DIAGNOSIS — Z0000 Encounter for general adult medical examination without abnormal findings: Secondary | ICD-10-CM | POA: Diagnosis not present

## 2021-04-09 DIAGNOSIS — N951 Menopausal and female climacteric states: Secondary | ICD-10-CM | POA: Diagnosis not present

## 2021-04-09 DIAGNOSIS — E039 Hypothyroidism, unspecified: Secondary | ICD-10-CM | POA: Diagnosis not present

## 2021-04-09 DIAGNOSIS — R748 Abnormal levels of other serum enzymes: Secondary | ICD-10-CM | POA: Diagnosis not present

## 2021-04-09 DIAGNOSIS — R5383 Other fatigue: Secondary | ICD-10-CM | POA: Diagnosis not present

## 2021-04-22 ENCOUNTER — Ambulatory Visit: Payer: Self-pay | Admitting: Dermatology

## 2021-04-30 DIAGNOSIS — F4389 Other reactions to severe stress: Secondary | ICD-10-CM | POA: Diagnosis not present

## 2021-04-30 DIAGNOSIS — F332 Major depressive disorder, recurrent severe without psychotic features: Secondary | ICD-10-CM | POA: Diagnosis not present

## 2021-04-30 DIAGNOSIS — F1021 Alcohol dependence, in remission: Secondary | ICD-10-CM | POA: Diagnosis not present

## 2021-09-25 DIAGNOSIS — G4700 Insomnia, unspecified: Secondary | ICD-10-CM | POA: Diagnosis not present

## 2021-09-25 DIAGNOSIS — E039 Hypothyroidism, unspecified: Secondary | ICD-10-CM | POA: Diagnosis not present

## 2021-09-25 DIAGNOSIS — E663 Overweight: Secondary | ICD-10-CM | POA: Diagnosis not present

## 2021-09-25 DIAGNOSIS — N951 Menopausal and female climacteric states: Secondary | ICD-10-CM | POA: Diagnosis not present

## 2021-09-30 DIAGNOSIS — M5030 Other cervical disc degeneration, unspecified cervical region: Secondary | ICD-10-CM | POA: Diagnosis not present

## 2021-09-30 DIAGNOSIS — M5412 Radiculopathy, cervical region: Secondary | ICD-10-CM | POA: Diagnosis not present

## 2021-10-07 DIAGNOSIS — M47812 Spondylosis without myelopathy or radiculopathy, cervical region: Secondary | ICD-10-CM | POA: Diagnosis not present

## 2022-03-27 ENCOUNTER — Institutional Professional Consult (permissible substitution): Payer: Self-pay | Admitting: Plastic Surgery

## 2022-08-06 DIAGNOSIS — R5383 Other fatigue: Secondary | ICD-10-CM | POA: Diagnosis not present

## 2022-08-06 DIAGNOSIS — E559 Vitamin D deficiency, unspecified: Secondary | ICD-10-CM | POA: Diagnosis not present

## 2022-08-06 DIAGNOSIS — E785 Hyperlipidemia, unspecified: Secondary | ICD-10-CM | POA: Diagnosis not present

## 2022-08-06 DIAGNOSIS — F32A Depression, unspecified: Secondary | ICD-10-CM | POA: Diagnosis not present

## 2022-08-06 DIAGNOSIS — N951 Menopausal and female climacteric states: Secondary | ICD-10-CM | POA: Diagnosis not present

## 2022-08-11 DIAGNOSIS — F1011 Alcohol abuse, in remission: Secondary | ICD-10-CM | POA: Diagnosis not present

## 2022-08-11 DIAGNOSIS — F3342 Major depressive disorder, recurrent, in full remission: Secondary | ICD-10-CM | POA: Diagnosis not present

## 2022-08-11 DIAGNOSIS — F1010 Alcohol abuse, uncomplicated: Secondary | ICD-10-CM | POA: Diagnosis not present

## 2022-08-11 DIAGNOSIS — Z5181 Encounter for therapeutic drug level monitoring: Secondary | ICD-10-CM | POA: Diagnosis not present

## 2022-08-11 DIAGNOSIS — F10220 Alcohol dependence with intoxication, uncomplicated: Secondary | ICD-10-CM | POA: Diagnosis not present

## 2022-10-15 DIAGNOSIS — Z111 Encounter for screening for respiratory tuberculosis: Secondary | ICD-10-CM | POA: Diagnosis not present

## 2022-11-23 DIAGNOSIS — N61 Mastitis without abscess: Secondary | ICD-10-CM | POA: Diagnosis not present

## 2022-11-23 DIAGNOSIS — N644 Mastodynia: Secondary | ICD-10-CM | POA: Diagnosis not present

## 2022-11-23 DIAGNOSIS — N63 Unspecified lump in unspecified breast: Secondary | ICD-10-CM | POA: Diagnosis not present

## 2022-12-02 DIAGNOSIS — R92333 Mammographic heterogeneous density, bilateral breasts: Secondary | ICD-10-CM | POA: Diagnosis not present

## 2022-12-02 DIAGNOSIS — N6323 Unspecified lump in the left breast, lower outer quadrant: Secondary | ICD-10-CM | POA: Diagnosis not present

## 2022-12-02 DIAGNOSIS — N6002 Solitary cyst of left breast: Secondary | ICD-10-CM | POA: Diagnosis not present

## 2022-12-02 DIAGNOSIS — N6314 Unspecified lump in the right breast, lower inner quadrant: Secondary | ICD-10-CM | POA: Diagnosis not present

## 2022-12-03 DIAGNOSIS — Z01419 Encounter for gynecological examination (general) (routine) without abnormal findings: Secondary | ICD-10-CM | POA: Diagnosis not present

## 2022-12-03 DIAGNOSIS — Z1331 Encounter for screening for depression: Secondary | ICD-10-CM | POA: Diagnosis not present

## 2023-02-11 DIAGNOSIS — F32A Depression, unspecified: Secondary | ICD-10-CM | POA: Diagnosis not present

## 2023-02-11 DIAGNOSIS — E785 Hyperlipidemia, unspecified: Secondary | ICD-10-CM | POA: Diagnosis not present

## 2023-02-11 DIAGNOSIS — N951 Menopausal and female climacteric states: Secondary | ICD-10-CM | POA: Diagnosis not present

## 2023-02-11 DIAGNOSIS — R5383 Other fatigue: Secondary | ICD-10-CM | POA: Diagnosis not present

## 2023-02-22 DIAGNOSIS — F1021 Alcohol dependence, in remission: Secondary | ICD-10-CM | POA: Diagnosis not present

## 2023-02-22 DIAGNOSIS — F4389 Other reactions to severe stress: Secondary | ICD-10-CM | POA: Diagnosis not present

## 2023-02-22 DIAGNOSIS — F332 Major depressive disorder, recurrent severe without psychotic features: Secondary | ICD-10-CM | POA: Diagnosis not present

## 2023-03-03 IMAGING — CT CT ABD-PELV W/ CM
2 of 5 series · 16 of 46 positions shown, 18 images · IV contrast (omnipaque)
Comparison: 05/31/2010

CLINICAL DATA: Epigastric pain

EXAM:
CT ABDOMEN AND PELVIS WITH CONTRAST
TECHNIQUE: Multidetector CT imaging of the abdomen and pelvis was performed
using the standard protocol following bolus administration of
intravenous contrast.
CONTRAST:  100mL OMNIPAQUE IOHEXOL 300 MG/ML  SOLN

[Series 2: axial st · axial · 0.82mm/px · z∈[+901,+1291]mm · 13 of 90 slices shown, 15 images]
[im 6/90  soft-tissue]
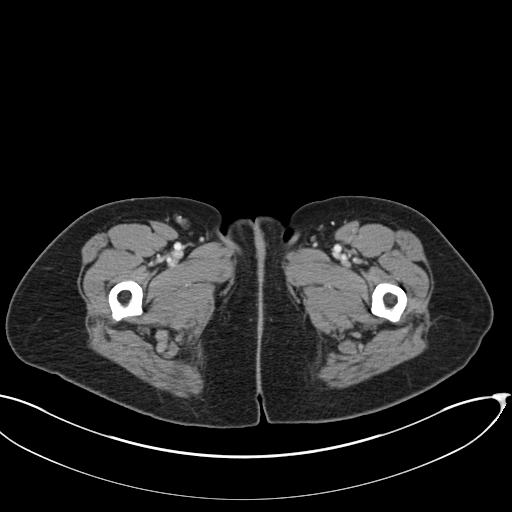
[im 6/90  bone]
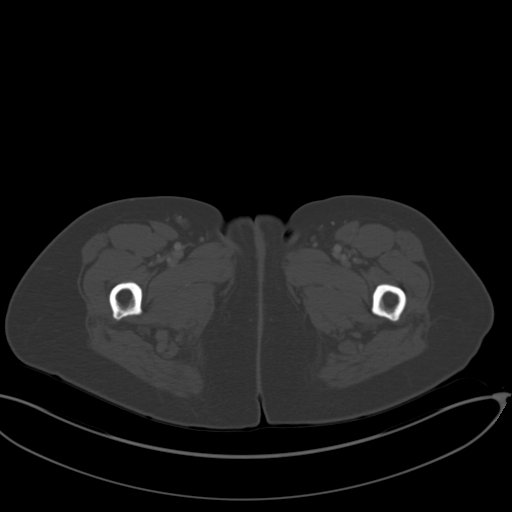
[im 12/90  soft-tissue]
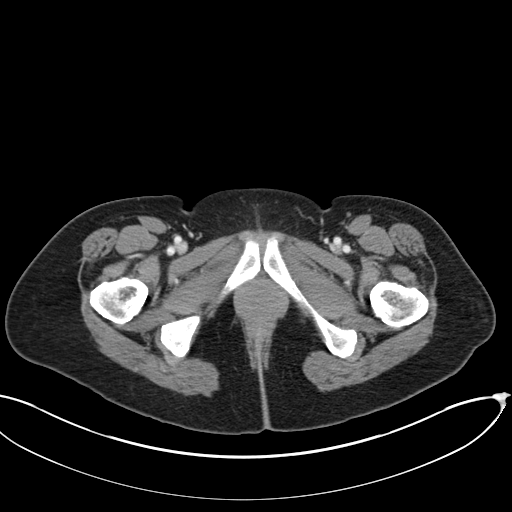
[im 17/90  soft-tissue]
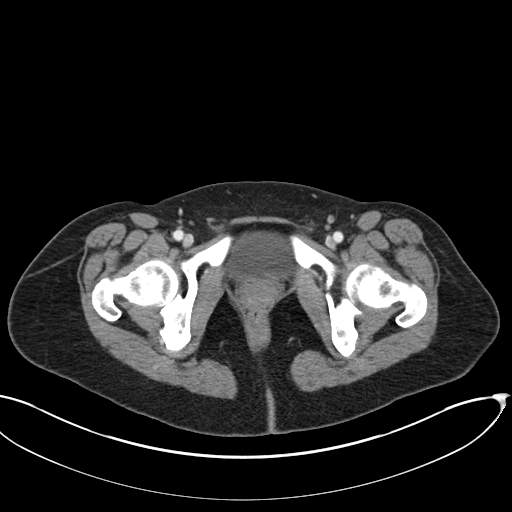
[im 28/90  soft-tissue]
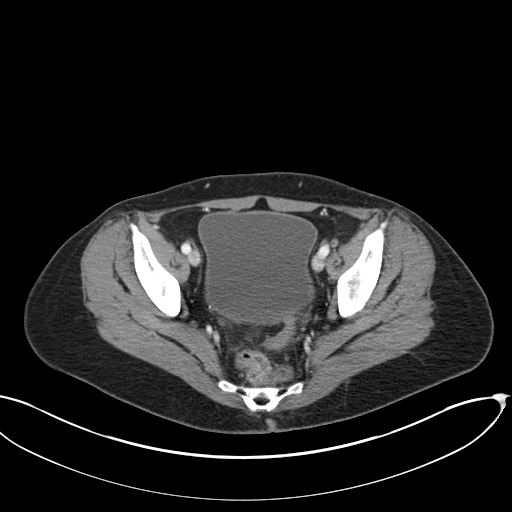
[im 34/90  soft-tissue]
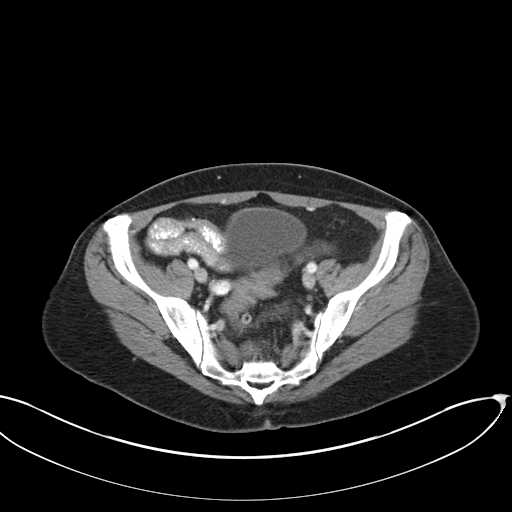
[im 39/90  soft-tissue]
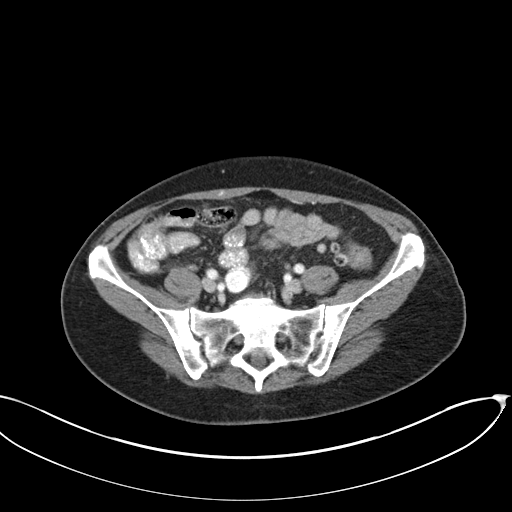
[im 45/90  soft-tissue]
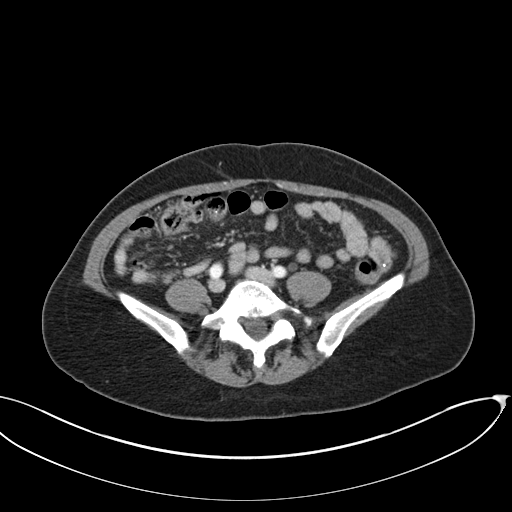
[im 51/90  soft-tissue]
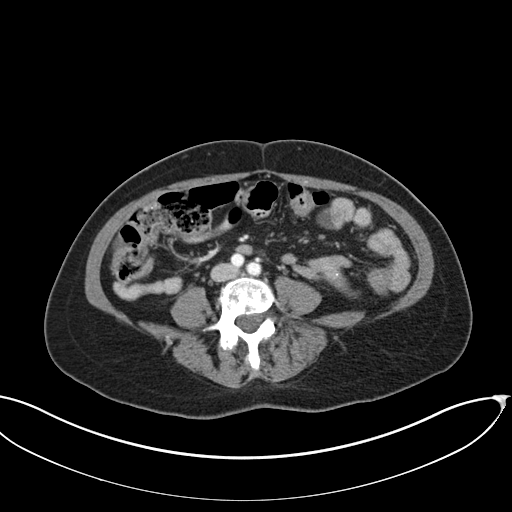
[im 56/90  soft-tissue]
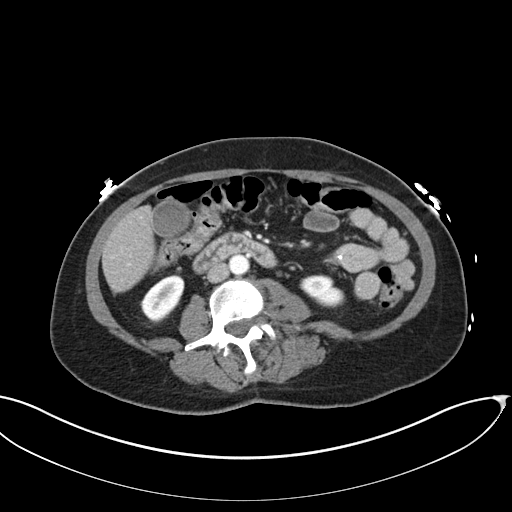
[im 56/90  bone]
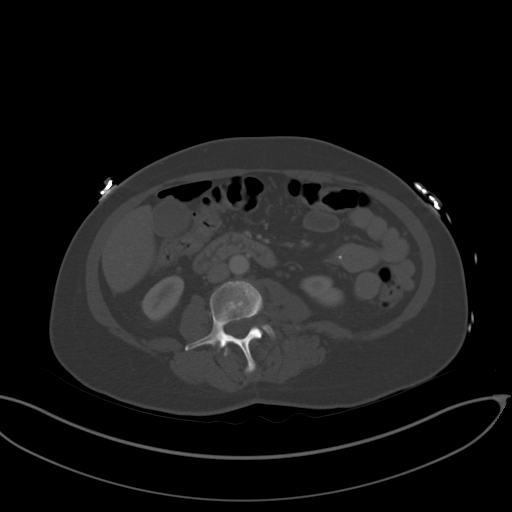
[im 62/90  soft-tissue]
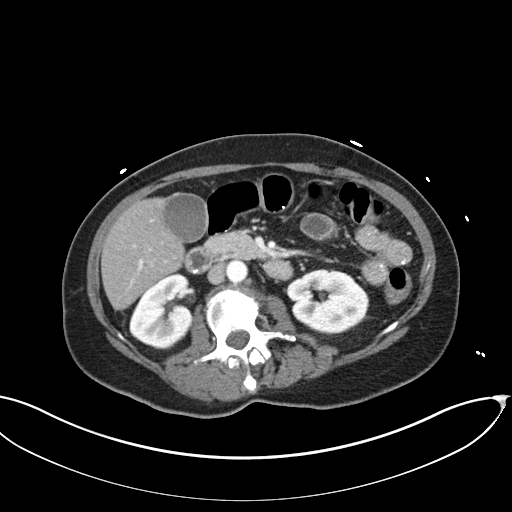
[im 73/90  soft-tissue]
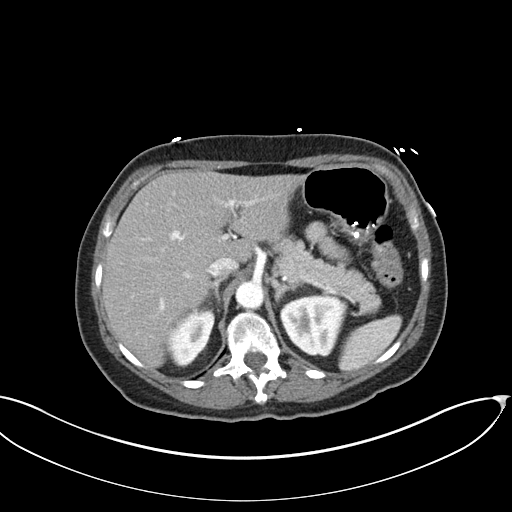
[im 78/90  soft-tissue]
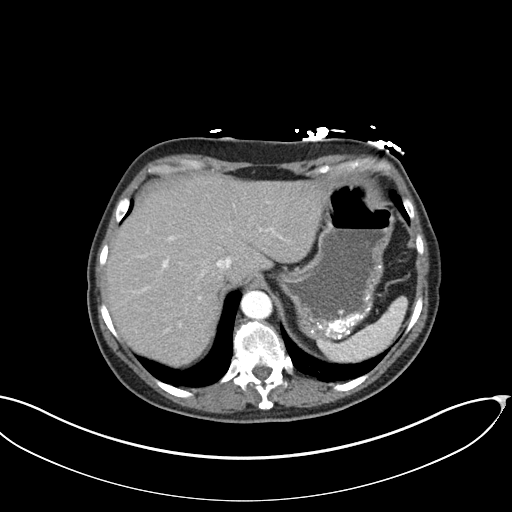
[im 84/90  soft-tissue]
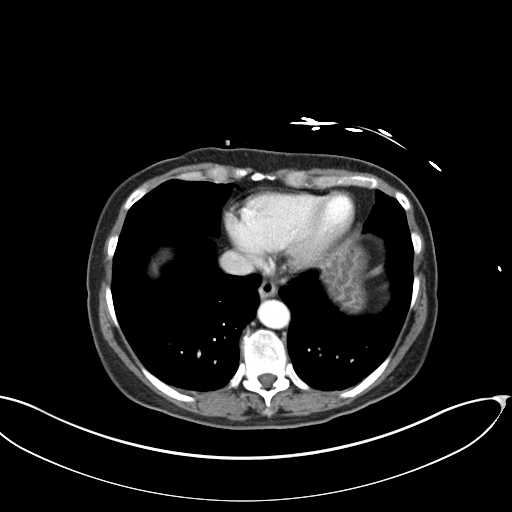

[Series 4: coronal st · coronal · 0.69mm/px · 3 of 129 slices shown]
[im 43/129  soft-tissue]
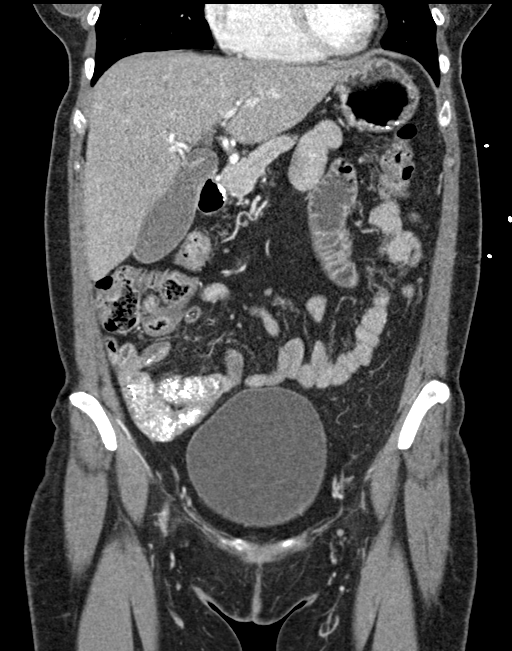
[im 57/129  soft-tissue]
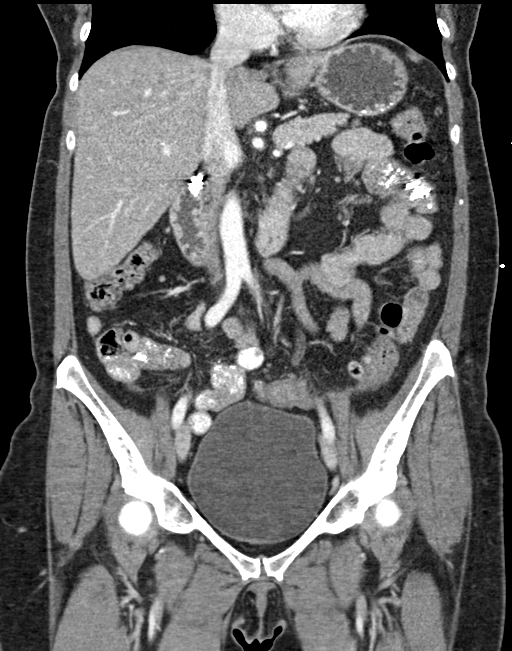
[im 72/129  soft-tissue]
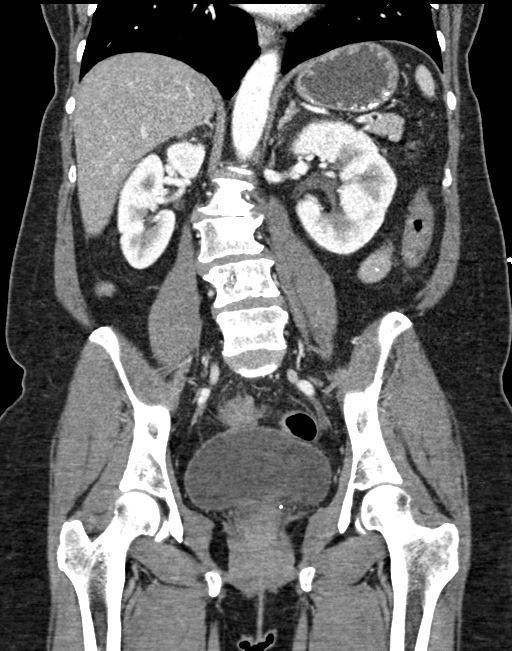

[16 of 46 positions shown; findings below may reference images not displayed]

FINDINGS: Lower chest: No acute abnormality.

Hepatobiliary: No solid liver abnormality is seen. No gallstones,
gallbladder wall thickening, or biliary dilatation.

Pancreas: Unremarkable. No pancreatic ductal dilatation or
surrounding inflammatory changes.

Spleen: Normal in size without significant abnormality.

Adrenals/Urinary Tract: Adrenal glands are unremarkable. Kidneys are
normal, without renal calculi, solid lesion, or hydronephrosis.
Bladder is unremarkable.

Stomach/Bowel: Stomach is within normal limits. Appendix is not
clearly visualized. No evidence of bowel wall thickening,
distention, or inflammatory changes. Occasional sigmoid diverticula.

Vascular/Lymphatic: No significant vascular findings are present. No
enlarged abdominal or pelvic lymph nodes.

Reproductive: Status post hysterectomy.

Other: No abdominal wall hernia or abnormality. No abdominopelvic
ascites.

Musculoskeletal: No acute or significant osseous findings.
IMPRESSION: 1. No acute CT findings of the abdomen or pelvis to explain
epigastric pain.
2. Status post hysterectomy.
3. Sigmoid diverticulosis.

## 2023-03-11 DIAGNOSIS — E039 Hypothyroidism, unspecified: Secondary | ICD-10-CM | POA: Diagnosis not present

## 2023-03-11 DIAGNOSIS — Z1212 Encounter for screening for malignant neoplasm of rectum: Secondary | ICD-10-CM | POA: Diagnosis not present

## 2023-03-11 DIAGNOSIS — R7989 Other specified abnormal findings of blood chemistry: Secondary | ICD-10-CM | POA: Diagnosis not present

## 2023-03-11 DIAGNOSIS — D649 Anemia, unspecified: Secondary | ICD-10-CM | POA: Diagnosis not present

## 2023-03-15 DIAGNOSIS — F4389 Other reactions to severe stress: Secondary | ICD-10-CM | POA: Diagnosis not present

## 2023-03-15 DIAGNOSIS — F332 Major depressive disorder, recurrent severe without psychotic features: Secondary | ICD-10-CM | POA: Diagnosis not present

## 2023-03-15 DIAGNOSIS — F1021 Alcohol dependence, in remission: Secondary | ICD-10-CM | POA: Diagnosis not present

## 2023-03-16 DIAGNOSIS — R82998 Other abnormal findings in urine: Secondary | ICD-10-CM | POA: Diagnosis not present

## 2023-03-18 DIAGNOSIS — Z0000 Encounter for general adult medical examination without abnormal findings: Secondary | ICD-10-CM | POA: Diagnosis not present

## 2023-03-18 DIAGNOSIS — E039 Hypothyroidism, unspecified: Secondary | ICD-10-CM | POA: Diagnosis not present

## 2023-03-18 DIAGNOSIS — Z1331 Encounter for screening for depression: Secondary | ICD-10-CM | POA: Diagnosis not present

## 2023-03-18 DIAGNOSIS — Z1339 Encounter for screening examination for other mental health and behavioral disorders: Secondary | ICD-10-CM | POA: Diagnosis not present

## 2023-04-12 DIAGNOSIS — F1021 Alcohol dependence, in remission: Secondary | ICD-10-CM | POA: Diagnosis not present

## 2023-04-12 DIAGNOSIS — F4389 Other reactions to severe stress: Secondary | ICD-10-CM | POA: Diagnosis not present

## 2023-04-12 DIAGNOSIS — F332 Major depressive disorder, recurrent severe without psychotic features: Secondary | ICD-10-CM | POA: Diagnosis not present

## 2023-07-19 DIAGNOSIS — F1011 Alcohol abuse, in remission: Secondary | ICD-10-CM | POA: Diagnosis not present

## 2023-07-19 DIAGNOSIS — F3341 Major depressive disorder, recurrent, in partial remission: Secondary | ICD-10-CM | POA: Diagnosis not present

## 2023-08-17 ENCOUNTER — Encounter: Payer: Self-pay | Admitting: Physician Assistant

## 2023-08-17 ENCOUNTER — Ambulatory Visit: Payer: Self-pay | Admitting: Physician Assistant

## 2023-08-17 VITALS — BP 108/74

## 2023-08-17 DIAGNOSIS — L578 Other skin changes due to chronic exposure to nonionizing radiation: Secondary | ICD-10-CM

## 2023-08-17 DIAGNOSIS — D229 Melanocytic nevi, unspecified: Secondary | ICD-10-CM

## 2023-08-17 DIAGNOSIS — Z808 Family history of malignant neoplasm of other organs or systems: Secondary | ICD-10-CM

## 2023-08-17 DIAGNOSIS — L814 Other melanin hyperpigmentation: Secondary | ICD-10-CM | POA: Diagnosis not present

## 2023-08-17 DIAGNOSIS — D1801 Hemangioma of skin and subcutaneous tissue: Secondary | ICD-10-CM

## 2023-08-17 DIAGNOSIS — Z1283 Encounter for screening for malignant neoplasm of skin: Secondary | ICD-10-CM

## 2023-08-17 DIAGNOSIS — W908XXA Exposure to other nonionizing radiation, initial encounter: Secondary | ICD-10-CM

## 2023-08-17 DIAGNOSIS — Z809 Family history of malignant neoplasm, unspecified: Secondary | ICD-10-CM

## 2023-08-17 DIAGNOSIS — L821 Other seborrheic keratosis: Secondary | ICD-10-CM

## 2023-08-17 NOTE — Addendum Note (Signed)
 Addended by: ORMAN AMERICA on: 08/17/2023 02:20 PM   Modules accepted: Orders

## 2023-08-17 NOTE — Progress Notes (Addendum)
   New Patient Visit   Subjective  Loretta Dawson is a 58 y.o. female who presents for the following: Skin Cancer Screening and Full Body Skin Exam - No history of skin cancer. Family history of melanoma in father.  The patient presents for Total-Body Skin Exam (TBSE) for skin cancer screening and mole check. The patient has spots, moles and lesions to be evaluated, some may be new or changing and the patient may have concern these could be cancer.    The following portions of the chart were reviewed this encounter and updated as appropriate: medications, allergies, medical history  Review of Systems:  No other skin or systemic complaints except as noted in HPI or Assessment and Plan.  Objective  Well appearing patient in no apparent distress; mood and affect are within normal limits.  A full examination was performed including scalp, head, eyes, ears, nose, lips, neck, chest, axillae, abdomen, back, buttocks, bilateral upper extremities, bilateral lower extremities, hands, feet, fingers, toes, fingernails, and toenails. All findings within normal limits unless otherwise noted below.   Relevant physical exam findings are noted in the Assessment and Plan.    Assessment & Plan   SKIN CANCER SCREENING PERFORMED TODAY.  ACTINIC DAMAGE - Chronic condition, secondary to cumulative UV/sun exposure - diffuse scaly erythematous macules with underlying dyspigmentation - Recommend daily broad spectrum sunscreen SPF 30+ to sun-exposed areas, reapply every 2 hours as needed.  - Staying in the shade or wearing long sleeves, sun glasses (UVA+UVB protection) and wide brim hats (4-inch brim around the entire circumference of the hat) are also recommended for sun protection.  - Call for new or changing lesions.  LENTIGINES, SEBORRHEIC KERATOSES, HEMANGIOMAS - Benign normal skin lesions - Benign-appearing - Call for any changes  MELANOCYTIC NEVI - Tan-brown and/or pink-flesh-colored symmetric  macules and papules - Benign appearing on exam today - Observation - Call clinic for new or changing moles - Recommend daily use of broad spectrum spf 30+ sunscreen to sun-exposed areas.   FAMILY HISTORY OF MELANOMA  What type(s): Melanoma Who affected: Father - recommend annual skin exams.        SCREENING EXAM FOR SKIN CANCER   ACTINIC SKIN DAMAGE   LENTIGINES   SEBORRHEIC KERATOSIS   CHERRY ANGIOMA   MULTIPLE BENIGN NEVI   FAMILY HISTORY OF MELANOMA    Return in about 1 year (around 08/16/2024) for TBSE.  I, Roseline Hutchinson, CMA, am acting as scribe for Mikaia Janvier K, PA-C .   Documentation: I have reviewed the above documentation for accuracy and completeness, and I agree with the above.  Rojelio Uhrich K, PA-C

## 2023-08-17 NOTE — Patient Instructions (Signed)

## 2023-09-16 DIAGNOSIS — Z23 Encounter for immunization: Secondary | ICD-10-CM | POA: Diagnosis not present

## 2023-09-16 DIAGNOSIS — I10 Essential (primary) hypertension: Secondary | ICD-10-CM | POA: Diagnosis not present

## 2023-09-16 DIAGNOSIS — E785 Hyperlipidemia, unspecified: Secondary | ICD-10-CM | POA: Diagnosis not present

## 2024-01-21 ENCOUNTER — Other Ambulatory Visit (HOSPITAL_BASED_OUTPATIENT_CLINIC_OR_DEPARTMENT_OTHER): Payer: Self-pay

## 2024-01-24 ENCOUNTER — Other Ambulatory Visit (HOSPITAL_BASED_OUTPATIENT_CLINIC_OR_DEPARTMENT_OTHER): Payer: Self-pay

## 2024-01-24 MED ORDER — DEXMETHYLPHENIDATE HCL 10 MG PO TABS
20.0000 mg | ORAL_TABLET | Freq: Every day | ORAL | 0 refills | Status: AC
  Filled 2024-01-24: qty 25, 13d supply, fill #0
  Filled 2024-01-24: qty 35, 17d supply, fill #0
  Filled 2024-01-24: qty 50, 25d supply, fill #0

## 2024-01-25 ENCOUNTER — Other Ambulatory Visit (HOSPITAL_BASED_OUTPATIENT_CLINIC_OR_DEPARTMENT_OTHER): Payer: Self-pay

## 2024-08-21 ENCOUNTER — Ambulatory Visit: Admitting: Physician Assistant
